# Patient Record
Sex: Female | Born: 1940 | Race: White | Hispanic: No | Marital: Married | Smoking: Never smoker
Health system: Southern US, Community
[De-identification: ages and names within clinical notes are randomized; demographics above are authoritative.]

## PROBLEM LIST (undated history)

## (undated) DIAGNOSIS — F329 Major depressive disorder, single episode, unspecified: Secondary | ICD-10-CM

## (undated) DIAGNOSIS — M76899 Other specified enthesopathies of unspecified lower limb, excluding foot: Secondary | ICD-10-CM

## (undated) DIAGNOSIS — G4733 Obstructive sleep apnea (adult) (pediatric): Secondary | ICD-10-CM

## (undated) DIAGNOSIS — E039 Hypothyroidism, unspecified: Secondary | ICD-10-CM

## (undated) DIAGNOSIS — M159 Polyosteoarthritis, unspecified: Secondary | ICD-10-CM

## (undated) DIAGNOSIS — M5030 Other cervical disc degeneration, unspecified cervical region: Secondary | ICD-10-CM

## (undated) DIAGNOSIS — E782 Mixed hyperlipidemia: Secondary | ICD-10-CM

## (undated) DIAGNOSIS — R159 Full incontinence of feces: Secondary | ICD-10-CM

## (undated) DIAGNOSIS — J309 Allergic rhinitis, unspecified: Secondary | ICD-10-CM

## (undated) DIAGNOSIS — E785 Hyperlipidemia, unspecified: Secondary | ICD-10-CM

## (undated) DIAGNOSIS — K219 Gastro-esophageal reflux disease without esophagitis: Secondary | ICD-10-CM

## (undated) DIAGNOSIS — F3289 Other specified depressive episodes: Secondary | ICD-10-CM

## (undated) DIAGNOSIS — N209 Urinary calculus, unspecified: Secondary | ICD-10-CM

## (undated) DIAGNOSIS — D539 Nutritional anemia, unspecified: Secondary | ICD-10-CM

## (undated) DIAGNOSIS — R413 Other amnesia: Secondary | ICD-10-CM

## (undated) DIAGNOSIS — J4489 Other specified chronic obstructive pulmonary disease: Secondary | ICD-10-CM

## (undated) DIAGNOSIS — IMO0002 Reserved for concepts with insufficient information to code with codable children: Secondary | ICD-10-CM

## (undated) DIAGNOSIS — M1990 Unspecified osteoarthritis, unspecified site: Secondary | ICD-10-CM

## (undated) DIAGNOSIS — K5790 Diverticulosis of intestine, part unspecified, without perforation or abscess without bleeding: Secondary | ICD-10-CM

## (undated) DIAGNOSIS — M1710 Unilateral primary osteoarthritis, unspecified knee: Secondary | ICD-10-CM

## (undated) DIAGNOSIS — J449 Chronic obstructive pulmonary disease, unspecified: Secondary | ICD-10-CM

## (undated) HISTORY — DX: Mixed hyperlipidemia: E78.2

## (undated) HISTORY — DX: Unspecified osteoarthritis, unspecified site: M19.90

## (undated) HISTORY — DX: Hypothyroidism, unspecified: E03.9

## (undated) HISTORY — DX: Nutritional anemia, unspecified: D53.9

## (undated) HISTORY — DX: Polyosteoarthritis, unspecified: M15.9

## (undated) HISTORY — DX: Gastro-esophageal reflux disease without esophagitis: K21.9

## (undated) HISTORY — DX: Other specified enthesopathies of unspecified lower limb, excluding foot: M76.899

## (undated) HISTORY — DX: Other specified depressive episodes: F32.89

## (undated) HISTORY — DX: Full incontinence of feces: R15.9

## (undated) HISTORY — DX: Unilateral primary osteoarthritis, unspecified knee: M17.10

## (undated) HISTORY — DX: Other cervical disc degeneration, unspecified cervical region: M50.30

## (undated) HISTORY — DX: Chronic obstructive pulmonary disease, unspecified: J44.9

## (undated) HISTORY — DX: Major depressive disorder, single episode, unspecified: F32.9

## (undated) HISTORY — DX: Reserved for concepts with insufficient information to code with codable children: IMO0002

## (undated) HISTORY — DX: Urinary calculus, unspecified: N20.9

## (undated) HISTORY — DX: Allergic rhinitis, unspecified: J30.9

## (undated) HISTORY — DX: Obstructive sleep apnea (adult) (pediatric): G47.33

## (undated) HISTORY — PX: INGUINAL HERNIA REPAIR: SUR1180

## (undated) HISTORY — DX: Other specified chronic obstructive pulmonary disease: J44.89

## (undated) HISTORY — DX: Hyperlipidemia, unspecified: E78.5

## (undated) HISTORY — DX: Diverticulosis of intestine, part unspecified, without perforation or abscess without bleeding: K57.90

## (undated) HISTORY — DX: Other amnesia: R41.3

---

## 2000-02-15 DIAGNOSIS — K219 Gastro-esophageal reflux disease without esophagitis: Secondary | ICD-10-CM

## 2000-02-15 HISTORY — DX: Gastro-esophageal reflux disease without esophagitis: K21.9

## 2001-02-18 DIAGNOSIS — J449 Chronic obstructive pulmonary disease, unspecified: Secondary | ICD-10-CM

## 2001-02-18 DIAGNOSIS — N209 Urinary calculus, unspecified: Secondary | ICD-10-CM

## 2001-02-18 HISTORY — DX: Chronic obstructive pulmonary disease, unspecified: J44.9

## 2001-02-18 HISTORY — DX: Urinary calculus, unspecified: N20.9

## 2001-11-25 DIAGNOSIS — F329 Major depressive disorder, single episode, unspecified: Secondary | ICD-10-CM

## 2001-11-25 HISTORY — DX: Major depressive disorder, single episode, unspecified: F32.9

## 2004-07-20 DIAGNOSIS — M76899 Other specified enthesopathies of unspecified lower limb, excluding foot: Secondary | ICD-10-CM | POA: Insufficient documentation

## 2004-07-20 DIAGNOSIS — M159 Polyosteoarthritis, unspecified: Secondary | ICD-10-CM | POA: Insufficient documentation

## 2004-12-09 DIAGNOSIS — M5137 Other intervertebral disc degeneration, lumbosacral region: Secondary | ICD-10-CM

## 2004-12-09 DIAGNOSIS — M51379 Other intervertebral disc degeneration, lumbosacral region without mention of lumbar back pain or lower extremity pain: Secondary | ICD-10-CM

## 2004-12-09 HISTORY — DX: Other intervertebral disc degeneration, lumbosacral region without mention of lumbar back pain or lower extremity pain: M51.379

## 2006-05-14 DIAGNOSIS — M5030 Other cervical disc degeneration, unspecified cervical region: Secondary | ICD-10-CM | POA: Insufficient documentation

## 2007-03-27 DIAGNOSIS — D539 Nutritional anemia, unspecified: Secondary | ICD-10-CM | POA: Insufficient documentation

## 2007-03-27 HISTORY — DX: Nutritional anemia, unspecified: D53.9

## 2008-01-13 DIAGNOSIS — J309 Allergic rhinitis, unspecified: Secondary | ICD-10-CM

## 2008-01-13 HISTORY — DX: Allergic rhinitis, unspecified: J30.9

## 2013-03-19 ENCOUNTER — Encounter: Payer: Self-pay | Admitting: Gastroenterology

## 2013-04-11 ENCOUNTER — Encounter: Payer: Self-pay | Admitting: Gastroenterology

## 2013-04-11 ENCOUNTER — Other Ambulatory Visit: Payer: Medicare Other

## 2013-04-11 ENCOUNTER — Ambulatory Visit (INDEPENDENT_AMBULATORY_CARE_PROVIDER_SITE_OTHER): Payer: Medicare Other | Admitting: Gastroenterology

## 2013-04-11 VITALS — BP 130/74 | HR 68 | Ht 64.5 in | Wt 146.4 lb

## 2013-04-11 DIAGNOSIS — K219 Gastro-esophageal reflux disease without esophagitis: Secondary | ICD-10-CM

## 2013-04-11 DIAGNOSIS — R1013 Epigastric pain: Secondary | ICD-10-CM

## 2013-04-11 DIAGNOSIS — Z791 Long term (current) use of non-steroidal anti-inflammatories (NSAID): Secondary | ICD-10-CM

## 2013-04-11 DIAGNOSIS — K3189 Other diseases of stomach and duodenum: Secondary | ICD-10-CM

## 2013-04-11 DIAGNOSIS — R1314 Dysphagia, pharyngoesophageal phase: Secondary | ICD-10-CM

## 2013-04-11 DIAGNOSIS — M1990 Unspecified osteoarthritis, unspecified site: Secondary | ICD-10-CM

## 2013-04-11 MED ORDER — OMEPRAZOLE 20 MG PO CPDR: 20.0000 mg | DELAYED_RELEASE_CAPSULE | Freq: Two times a day (BID) | ORAL | Status: DC

## 2013-04-11 NOTE — Progress Notes (Signed)
History of Present Illness:  This is a very nice 72 year old retired Caucasian female from [place] referred by Dr. Majel Homer.  Accompanying the patient are a variety of notes and lab review use.  Apparently the patient also has had recent normal upper abdominal ultrasound exam.  She is referred for evaluation of multiple GI complaints.  This patient is a" years" of a" finicky stomach" which she described as postprandial gas, bloating, and occasional diarrhea usually one hour after meals without any real precipitating event with specific food intolerances except lactose..  This occurs approximately once a week without real precipitating or otherwise alleviating events.  Since June of this year she's been placed on Prilosec 40 mg/day without improvement.  She has chronic degenerative arthritis ,and is on Lodine 500 mg twice a day.  She does have reflux symptoms, hoarseness, also describes intermittent dysphagia for solids and liquids.  She's had several colonoscopies, most recently 2 years ago, but I do not have these reports.  Apparently these were normal.  Patient not had previous endoscopy or barium swallow.  Over the last year she has voluntarily lost 20 pounds on a low carbohydrate diet.  There is no history of bowel or regularity, melena, hematochezia, or abdominal pain.  Family history is remarkable for son who apparently has IBS.  She denies foreign travel, infectious disease exposure, or heavy use of alcohol or cigarettes.  There also is no history of undiagnosed skin rashes, swollen joints, or mouth sores .She denies Raynaud's phenomena, history of chronic liver disease, prior hepatitis or pancreatitis, or any chronic hematologic disorders.  In addition to Lodine she is on Flexeril,Plaquenil 200 mg twice a day, Synthroid, Zocor, B6 and B12 supplements   I have reviewed this patient's present history, medical and surgical past history, allergies and medications.     ROS:   All  systems were reviewed and are negative unless otherwise stated in the HPI.  Allergies  Allergen Reactions  . Amoxicillin Nausea Only   Outpatient Prescriptions Prior to Visit  Medication Sig Dispense Refill  . b complex vitamins tablet Take 1 tablet by mouth daily.      . Biotin 2500 MCG CAPS Take 1 capsule by mouth 2 (two) times daily.      . Calcium-Magnesium-Vitamin D (CALCIUM MAGNESIUM PO) Take 1 tablet by mouth daily.      Marland Kitchen etodolac (LODINE) 500 MG tablet Take 500 mg by mouth 2 (two) times daily.      . hydroxychloroquine (PLAQUENIL) 200 MG tablet Take 200 mg by mouth 2 (two) times daily.      Marland Kitchen levothyroxine (SYNTHROID, LEVOTHROID) 75 MCG tablet Take 75 mcg by mouth daily before breakfast.      . omeprazole (PRILOSEC) 20 MG capsule Take 20 mg by mouth 2 (two) times daily.      Marland Kitchen pyridOXINE (VITAMIN B-6) 100 MG tablet Take 100 mg by mouth daily.      . simvastatin (ZOCOR) 40 MG tablet Take 40 mg by mouth every evening.      . vitamin B-12 (CYANOCOBALAMIN) 1000 MCG tablet Take 1,000 mcg by mouth daily.      Marland Kitchen aspirin 81 MG tablet Take 81 mg by mouth daily.      . Cholecalciferol (VITAMIN D-3) 5000 UNITS TABS Take 1 tablet by mouth daily.      . Multiple Vitamin (MULTIVITAMIN) tablet Take 1 tablet by mouth daily.       No facility-administered medications prior to visit.   Past  Medical History  Diagnosis Date  . Unspecified hypothyroidism   . Esophageal reflux   . Chronic airway obstruction, not elsewhere classified   . Urinary calculus, unspecified   . Memory loss   . Depressive disorder, not elsewhere classified   . Unspecified arthropathy, lower leg   . Generalized osteoarthrosis, involving multiple sites   . Enthesopathy of hip region   . Other and unspecified hyperlipidemia   . Degeneration of cervical intervertebral disc   . Unspecified deficiency anemia   . Allergic rhinitis, cause unspecified   . Full incontinence of feces   . Arthritis   . Diverticulosis     Past Surgical History  Procedure Laterality Date  . Inguinal hernia repair Right    History   Social History  . Marital Status: Married    Spouse Name: N/A    Number of Children: 2  . Years of Education: N/A   Occupational History  . retired Engineer, site    Social History Main Topics  . Smoking status: Never Smoker   . Smokeless tobacco: Never Used  . Alcohol Use: Yes     Comment: moderate  . Drug Use: No  . Sexual Activity: None   Other Topics Concern  . None   Social History Narrative  . None   Family History  Problem Relation Age of Onset  . Heart disease Father   . Heart disease Mother   . Heart disease Brother        Physical Exam: At pressure 130/74, pulse 60 and regular and weight 146 with a BMI of 24.75. General well developed well nourished patient in no acute distress, appearing their stated age Eyes PERRLA, no icterus, fundoscopic exam per opthamologist Skin no lesions noted Neck supple, no adenopathy, no thyroid enlargement, no tenderness Chest clear to percussion and auscultation Heart no significant murmurs, gallops or rubs noted Abdomen no hepatosplenomegaly masses or tenderness, BS normal.  No abdominal distention and bowel sounds are normal.  She does have a prominent epigastric aortic pulse no significant abdominal bruits. Rectal inspection normal no fissures, or fistulae noted.  No masses or tenderness on digital exam. Stool guaiac negative. Extremities no acute joint lesions, edema, phlebitis or evidence of cellulitis. Neurologic patient oriented x 3, cranial nerves intact, no focal neurologic deficits noted. Psychological mental status normal and normal affect.  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  Patient also has chronic dyspepsia may have H. pylori infection, and may have a gastric or duodenal ulcer associated with her chronic NSAID use for her degenerative arthritis.  I've increased her  Prilosec to 40 mg twice a day, and have scheduled endoscopic exam.  We also will screen her for celiac disease with celiac serologies today and small bowel biopsy the time of her endoscopy.  She seemed up-to-date on her colonoscopy exams.  I've advised to continue other medications as per primary care.  Review of her labs shows no abnormalities with normal CBC except for platelet count 105,000, white count 3200, and hemoglobin 12.9.  Metabolic profile/ liver function test are normal.  I could not appreciate any evidence of chronic liver disease or splenomegaly on exam today.  Serum creatinine 0.83.  Please copy her primary care physician, referring physician, and pertinent subspecialists.

## 2013-04-11 NOTE — Patient Instructions (Signed)
You have been scheduled for an endoscopy with propofol. Please follow written instructions given to you at your visit today. If you use inhalers (even only as needed), please bring them with you on the day of your procedure. Your physician has requested that you go to www.[email or web address].com and enter the access code given to you at your visit today. This web site gives a general overview about your procedure. However, you should still follow specific instructions given to you by our office regarding your preparation for the procedure.  Your physician has requested that you go to the basement for the following lab work before leaving today: Celiac Panel  Please continue Protonix twice daily, new prescription was sent to your pharmacy. __________________________________________________________________________                                               We are excited to introduce MyChart, a new best-in-class service that provides you online access to important information in your electronic medical record. We want to make it easier for you to view your health information - all in one secure location - when and where you need it. We expect MyChart will enhance the quality of care and service we provide.  When you register for MyChart, you can:    View your test results.    Request appointments and receive appointment reminders via email.    Request medication renewals.    View your medical history, allergies, medications and immunizations.    Communicate with your physician's office through a password-protected site.    Conveniently print information such as your medication lists.  To find out if MyChart is right for you, please talk to a member of our clinical staff today. We will gladly answer your questions about this free health and wellness tool.  If you are age 72 or older and want a member of your family to have access to your record, you must provide written consent by completing a  proxy form available at our office. Please speak to our clinical staff about guidelines regarding accounts for patients younger than age 72.  As you activate your MyChart account and need any technical assistance, please call the MyChart technical support line at [phone]) or email your question to [email or web address]. If you email your question(s), please include your name, a return phone number and the best time to reach you.  If you have non-urgent health-related questions, you can send a message to our office through MyChart at [email or web address]. If you have a medical emergency, call 911.  Thank you for using MyChart as your new health and wellness resource!   MyChart licensed from [employer],  [id number]. Patents Pending.

## 2013-04-14 LAB — CELIAC PANEL 10
Endomysial Screen: NEGATIVE
Gliadin IgA: 2.9 U/mL (ref <20)
Gliadin IgG: 2.7 U/mL (ref <20)
IgA: 311 mg/dL (ref 69–380)
Tissue Transglut Ab: 6.6 U/mL (ref <20)
Tissue Transglutaminase Ab, IgA: 3.3 U/mL (ref <20)

## 2013-04-30 ENCOUNTER — Encounter: Payer: Self-pay | Admitting: Gastroenterology

## 2013-04-30 ENCOUNTER — Ambulatory Visit (AMBULATORY_SURGERY_CENTER): Payer: Medicare Other | Admitting: Gastroenterology

## 2013-04-30 VITALS — BP 131/63 | HR 78 | Temp 97.7°F | Resp 18 | Ht 64.0 in | Wt 146.0 lb

## 2013-04-30 DIAGNOSIS — K219 Gastro-esophageal reflux disease without esophagitis: Secondary | ICD-10-CM

## 2013-04-30 DIAGNOSIS — D1330 Benign neoplasm of unspecified part of small intestine: Secondary | ICD-10-CM

## 2013-04-30 MED ORDER — SODIUM CHLORIDE 0.9 % IV SOLN: 500.0000 mL | INTRAVENOUS | Status: DC

## 2013-04-30 NOTE — Progress Notes (Signed)
Patient did not experience any of the following events: a burn prior to discharge; a fall within the facility; wrong site/side/patient/procedure/implant event; or a hospital transfer or hospital admission upon discharge from the facility. (G8907) Patient did not have preoperative order for IV antibiotic SSI prophylaxis. (G8918)  

## 2013-04-30 NOTE — Patient Instructions (Addendum)

## 2013-04-30 NOTE — Op Note (Signed)
[facility] [address] [place], [address]   ENDOSCOPY PROCEDURE REPORT  PATIENT: Melanie, Smith  MR#: [id number] BIRTHDATE: 05/28/1941 , 72  yrs. old GENDER: Female ENDOSCOPIST:David Hale Bogus, MD, [facility] REFERRED BY: PROCEDURE DATE:  04/30/2013 PROCEDURE:   EGD w/ biopsy and EGD w/ biopsy for H.pylori ASA CLASS:    Class II INDICATIONS: Dyspepsia. MEDICATION: propofol (Diprivan) 100mg  IV TOPICAL ANESTHETIC:   Cetacaine Spray  DESCRIPTION OF PROCEDURE:   After the risks and benefits of the procedure were explained, informed consent was obtained.  The [facility] [id number]  endoscope was introduced through the mouth  and advanced to the    .  The instrument was slowly withdrawn as the mucosa was fully examined.      DUODENUM: The duodenal mucosa showed no abnormalities in the bulb and second portion of the duodenum.  Cold forceps biopsies were taken in the bulb and second portion.  STOMACH: The mucosa of the stomach appeared normal.  A biopsy was performed.  ESOPHAGUS: The mucosa of the esophagus appeared normal. Retroflexed views revealed a small hiatal hernia.    The scope was then withdrawn from the patient and the procedure completed.  COMPLICATIONS: There were no complications.   ENDOSCOPIC IMPRESSION: 1.   The duodenal mucosa showed no abnormalities in the bulb and second portion of the duodenum .Marland KitchenSI Bx. done. 2.   The mucosa of the stomach appeared normal; biopsy.Marland KitchenCLO Bx. done  3.   The mucosa of the esophagus appeared normal ..probale chronic treated GERD.  RECOMMENDATIONS: 1.  Continue PPI 2.  Continue current medications 3.  Await biopsy results 4.  Rx CLO if positive    _______________________________ eSigned:  Mardella Layman, MD, [facility] 04/30/2013 1:55 PM      PATIENT NAME:  Melanie, Smith MR#: [id number]

## 2013-04-30 NOTE — Progress Notes (Signed)
Called to room to assist during endoscopic procedure.  Patient ID and intended procedure confirmed with present staff. Received instructions for my participation in the procedure from the performing physician.  

## 2013-04-30 NOTE — Progress Notes (Signed)
Lidocaine-40mg IV prior to Propofol InductionPropofol given over incremental dosages 

## 2013-05-01 ENCOUNTER — Telehealth: Payer: Self-pay | Admitting: *Deleted

## 2013-05-01 LAB — HELICOBACTER PYLORI SCREEN-BIOPSY: UREASE: NEGATIVE

## 2013-05-01 NOTE — Telephone Encounter (Signed)
  Follow up Call-  Call back number 04/30/2013  Post procedure Call Back phone  # [phone]  Permission to leave phone message Yes     Patient questions:  Do you have a fever, pain , or abdominal swelling? no Pain Score  0 *  Have you tolerated food without any problems? yes  Have you been able to return to your normal activities? yes  Do you have any questions about your discharge instructions: Diet   no Medications  no Follow up visit  no  Do you have questions or concerns about your Care? no  Actions: * If pain score is 4 or above: No action needed, pain <4.

## 2013-05-02 ENCOUNTER — Encounter: Payer: Self-pay | Admitting: Gastroenterology

## 2013-05-06 ENCOUNTER — Encounter: Payer: Self-pay | Admitting: Gastroenterology

## 2013-07-03 ENCOUNTER — Other Ambulatory Visit: Payer: Self-pay

## 2013-12-30 ENCOUNTER — Telehealth: Payer: Self-pay | Admitting: Gastroenterology

## 2013-12-30 NOTE — Telephone Encounter (Signed)
Patient requests to see Dr. Olevia Perches. She reports that she quit taking her Omeprazole about a month ago because it did not help her. She reports indigestion, hoarse voice and difficulty swallowing at times. These symptoms have not worsened since she stopped the Omeprazole. She states she follows a GERD diet. Scheduled with Dr. Olevia Perches on 01/08/14 at 8:45 AM.

## 2014-01-01 ENCOUNTER — Encounter: Payer: Self-pay | Admitting: *Deleted

## 2014-01-08 ENCOUNTER — Ambulatory Visit (INDEPENDENT_AMBULATORY_CARE_PROVIDER_SITE_OTHER): Payer: Medicare Other | Admitting: Internal Medicine

## 2014-01-08 ENCOUNTER — Encounter: Payer: Self-pay | Admitting: Internal Medicine

## 2014-01-08 VITALS — BP 118/60 | HR 72 | Ht 64.5 in | Wt 148.5 lb

## 2014-01-08 DIAGNOSIS — R197 Diarrhea, unspecified: Secondary | ICD-10-CM

## 2014-01-08 DIAGNOSIS — R1013 Epigastric pain: Secondary | ICD-10-CM

## 2014-01-08 MED ORDER — DICYCLOMINE HCL 20 MG PO TABS: 20.0000 mg | ORAL_TABLET | ORAL | Status: DC

## 2014-01-08 MED ORDER — SUCRALFATE 1 G PO TABS: 1.0000 g | ORAL_TABLET | Freq: Two times a day (BID) | ORAL | Status: DC

## 2014-01-08 NOTE — Progress Notes (Signed)
Melanie Smith [birth year] [id number]  Note: This dictation was prepared with Dragon digital system. Any transcriptional errors that result from this procedure are unintentional.   History of Present Illness:  This is a 73 year old, white female with several gastrointestinal issues. She is a former patient of  Dr. Sharlett Iles, last OV in September 2014 forhoarsness and cough. An upper endoscopy and small bowel biopsies were negative. She was put on omeprazole 40 mg twice daily for several months without improvement. She still has hoarseness and dry hacking cough. She has been on Lodine 500 mg twice a day for osteoarthritis. She is also on Plaquenil  200 mg twice a day. She has never had a gastric ulcer. Another complaint today is urgent bowel movements in the mornings preceded by crampy abdominal pain. She denies any rectal bleeding. She has had 2 screening colonoscopies in [place], [place] . We will request those reports. She has intentionally lost 28 pounds but has regained 8 lbs.. She denies  history of gallbladder problems. She has been on a healthy high-fiber diet.    Past Medical History  Diagnosis Date  . Unspecified hypothyroidism   . GERD (gastroesophageal reflux disease)   . Chronic airway obstruction, not elsewhere classified   . Urinary calculus, unspecified   . Memory loss   . Depressive disorder, not elsewhere classified   . Unspecified arthropathy, lower leg   . Generalized osteoarthrosis, involving multiple sites   . Enthesopathy of hip region   . Other and unspecified hyperlipidemia   . Degeneration of cervical intervertebral disc   . Unspecified deficiency anemia   . Allergic rhinitis, cause unspecified   . Full incontinence of feces   . Arthritis   . Diverticulosis     Past Surgical History  Procedure Laterality Date  . Inguinal hernia repair Right     Allergies  Allergen Reactions  . Amoxicillin Nausea Only    Family history and social history have been  reviewed.  Review of Systems: Occasional dysphagia. Dyspepsia epigastric discomfort change in bowel habits  The remainder of the 10 point ROS is negative except as outlined in the H&P  Physical Exam: General Appearance Well developed, in no distress normal voice Eyes  Non icteric  HEENT  Non traumatic, normocephalic  Mouth No lesion, tongue papillated, no cheilosis Neck Supple without adenopathy, thyroid not enlarged, no carotid bruits, no JVD Lungs Clear to auscultation bilaterally COR Normal S1, normal S2, regular rhythm, no murmur, quiet precordium Abdomen Soft nontender with normoactive bowel sounds. No distention. No tympany. Liver edge at costal margin Rectal soft Hemoccult negative stool  Extremities  No pedal edema Skin No lesions Neurological Alert and oriented x 3 Psychological Normal mood and affect  Assessment and Plan:   Problem #1 worsening cough which did not respond to high dose PPI's and therefore is not likely caused by gastroesophageal reflux. I suggested that she see and ear, nose and throat specialist for further evaluation. As far as her dyspepsia is concerned, she has been on anti-inflammatory agents Lodine and Plaquanil  long-term and although an upper endoscopy did not show an ulcer, these medications are likely to cause gastropathy.  Problem #2 I asked patient to cut back on Lodine to only one a day and discuss with her cardiologist adjustment of her medications if possible. We will add Carafate 1 g twice a day for gastropathy. As far as her lower abdominal cramps and diarrhea are concerned, it sounds more like an irritable bowel syndrome or possibly symptomatic diverticulosis.  We will start her on Bentyl 20 mg every morning as an antispasmodic. She is already taking probiotics. She did not want to take any extra fiber since she feels that she is taking enough fiber in her regular diet. We will check a sprue profile. She will let us know if symptoms  continue.    [place] Dragon 01/08/2014

## 2014-01-08 NOTE — Patient Instructions (Addendum)
We have sent the following medications to your pharmacy for you to pick up at your convenience: Bentyl 20 mg every morning Carafate 1 gram twice daily  Please decrease your Lodine to 1 tablet daily.  Please contact Dr Janace Hoard, ENT to get an appointment for your hoarseness since this does not seem GI related. His phone number is [phone].  We will get your colonoscopy reports from [place], [place].  CC:Dr Harl Bowie

## 2015-02-15 DIAGNOSIS — J383 Other diseases of vocal cords: Secondary | ICD-10-CM | POA: Insufficient documentation

## 2015-02-15 DIAGNOSIS — R1310 Dysphagia, unspecified: Secondary | ICD-10-CM

## 2015-02-15 DIAGNOSIS — R490 Dysphonia: Secondary | ICD-10-CM

## 2015-02-15 HISTORY — DX: Dysphagia, unspecified: R13.10

## 2015-02-15 HISTORY — DX: Other diseases of vocal cords: J38.3

## 2015-02-15 HISTORY — DX: Dysphonia: R49.0

## 2015-04-07 ENCOUNTER — Other Ambulatory Visit: Payer: Self-pay | Admitting: Orthopaedic Surgery

## 2015-04-07 DIAGNOSIS — M25551 Pain in right hip: Secondary | ICD-10-CM

## 2015-04-20 ENCOUNTER — Ambulatory Visit
Admission: RE | Admit: 2015-04-20 | Discharge: 2015-04-20 | Disposition: A | Discharge status: Home / Self Care | Payer: Medicare Other | Source: Ambulatory Visit | Attending: Orthopaedic Surgery | Admitting: Orthopaedic Surgery

## 2015-04-20 DIAGNOSIS — M25551 Pain in right hip: Secondary | ICD-10-CM

## 2016-03-13 DIAGNOSIS — M170 Bilateral primary osteoarthritis of knee: Secondary | ICD-10-CM | POA: Insufficient documentation

## 2016-03-13 HISTORY — DX: Bilateral primary osteoarthritis of knee: M17.0

## 2016-06-14 ENCOUNTER — Encounter (INDEPENDENT_AMBULATORY_CARE_PROVIDER_SITE_OTHER): Payer: Medicare Other | Admitting: Ophthalmology

## 2016-06-14 DIAGNOSIS — H353131 Nonexudative age-related macular degeneration, bilateral, early dry stage: Secondary | ICD-10-CM | POA: Diagnosis not present

## 2016-06-14 DIAGNOSIS — H40013 Open angle with borderline findings, low risk, bilateral: Secondary | ICD-10-CM

## 2016-06-14 DIAGNOSIS — H43813 Vitreous degeneration, bilateral: Secondary | ICD-10-CM

## 2016-10-03 DIAGNOSIS — D696 Thrombocytopenia, unspecified: Secondary | ICD-10-CM | POA: Insufficient documentation

## 2016-10-03 HISTORY — DX: Thrombocytopenia, unspecified: D69.6

## 2016-12-26 DIAGNOSIS — R3981 Functional urinary incontinence: Secondary | ICD-10-CM

## 2016-12-26 HISTORY — DX: Functional urinary incontinence: R39.81

## 2017-05-17 ENCOUNTER — Other Ambulatory Visit: Payer: Self-pay | Admitting: Orthopedic Surgery

## 2017-05-17 DIAGNOSIS — M545 Low back pain: Secondary | ICD-10-CM

## 2017-06-01 ENCOUNTER — Ambulatory Visit
Admission: RE | Admit: 2017-06-01 | Discharge: 2017-06-01 | Disposition: A | Discharge status: Home / Self Care | Payer: Medicare Other | Source: Ambulatory Visit | Attending: Orthopedic Surgery | Admitting: Orthopedic Surgery

## 2017-06-01 DIAGNOSIS — M545 Low back pain: Secondary | ICD-10-CM

## 2017-07-06 DIAGNOSIS — I358 Other nonrheumatic aortic valve disorders: Secondary | ICD-10-CM | POA: Insufficient documentation

## 2017-07-06 DIAGNOSIS — I351 Nonrheumatic aortic (valve) insufficiency: Secondary | ICD-10-CM | POA: Insufficient documentation

## 2017-07-06 DIAGNOSIS — I340 Nonrheumatic mitral (valve) insufficiency: Secondary | ICD-10-CM

## 2017-07-06 HISTORY — DX: Nonrheumatic aortic (valve) insufficiency: I35.1

## 2017-07-06 HISTORY — DX: Nonrheumatic mitral (valve) insufficiency: I34.0

## 2017-07-06 HISTORY — DX: Other nonrheumatic aortic valve disorders: I35.8

## 2018-01-08 DIAGNOSIS — M5416 Radiculopathy, lumbar region: Secondary | ICD-10-CM | POA: Insufficient documentation

## 2018-01-08 HISTORY — DX: Radiculopathy, lumbar region: M54.16

## 2018-02-24 DIAGNOSIS — M7989 Other specified soft tissue disorders: Secondary | ICD-10-CM

## 2018-02-24 DIAGNOSIS — M79604 Pain in right leg: Secondary | ICD-10-CM

## 2018-02-24 HISTORY — DX: Pain in right leg: M79.604

## 2018-02-24 HISTORY — DX: Other specified soft tissue disorders: M79.89

## 2018-03-25 DIAGNOSIS — I10 Essential (primary) hypertension: Secondary | ICD-10-CM

## 2018-03-25 HISTORY — DX: Essential (primary) hypertension: I10

## 2018-09-29 DIAGNOSIS — T84099A Other mechanical complication of unspecified internal joint prosthesis, initial encounter: Secondary | ICD-10-CM | POA: Insufficient documentation

## 2018-09-29 HISTORY — DX: Other mechanical complication of unspecified internal joint prosthesis, initial encounter: T84.099A

## 2019-02-24 DIAGNOSIS — M461 Sacroiliitis, not elsewhere classified: Secondary | ICD-10-CM

## 2019-02-24 HISTORY — DX: Sacroiliitis, not elsewhere classified: M46.1

## 2019-05-19 DIAGNOSIS — I6522 Occlusion and stenosis of left carotid artery: Secondary | ICD-10-CM

## 2019-05-19 HISTORY — DX: Occlusion and stenosis of left carotid artery: I65.22

## 2019-05-27 DIAGNOSIS — R011 Cardiac murmur, unspecified: Secondary | ICD-10-CM | POA: Insufficient documentation

## 2019-05-27 HISTORY — DX: Cardiac murmur, unspecified: R01.1

## 2019-06-20 ENCOUNTER — Ambulatory Visit: Payer: Medicare Other | Admitting: Cardiovascular Disease

## 2019-07-11 IMAGING — MR MR LUMBAR SPINE W/O CM
4 of 5 series · 23 of 48 positions shown · non-contrast
Comparison: None.

CLINICAL DATA: 76-year-old female with lumbar back pain radiating
to the right hip and need for 3 months. Numbness and weakness in the
right leg. No known injury. Spinal injection in [REDACTED] with
temporary relief.

EXAM:
MRI LUMBAR SPINE WITHOUT CONTRAST
TECHNIQUE: Multiplanar, multisequence MR imaging of the lumbar spine was
performed. No intravenous contrast was administered.

[Series 3: T2 · sagittal · 4.0mm · 0.55mm/px · 7 of 17 slices shown (1 of 2)]
[im 1/17]
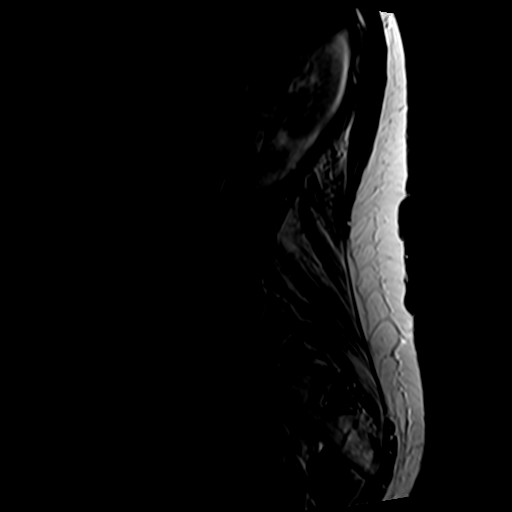
[im 3/17]
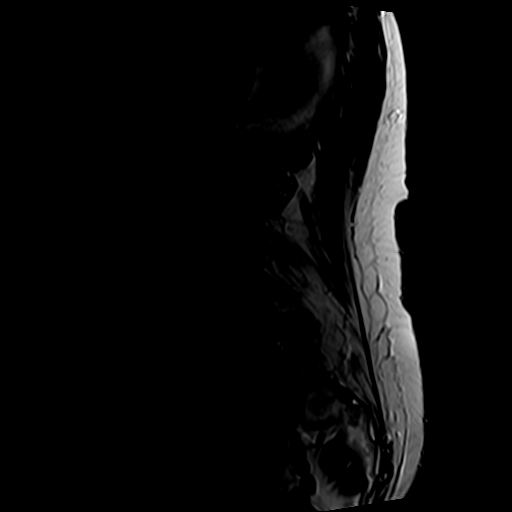
[im 6/17]
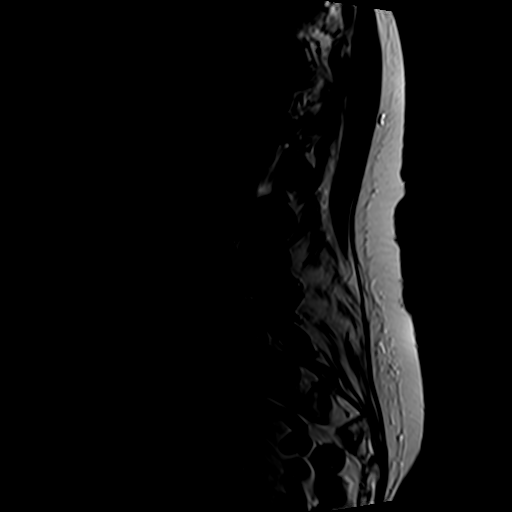
[im 9/17]
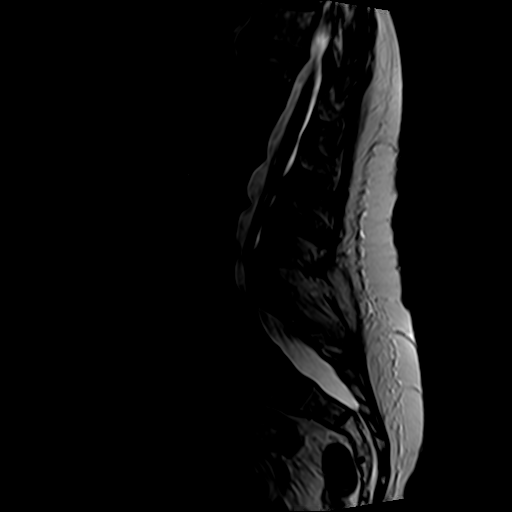
[im 11/17]
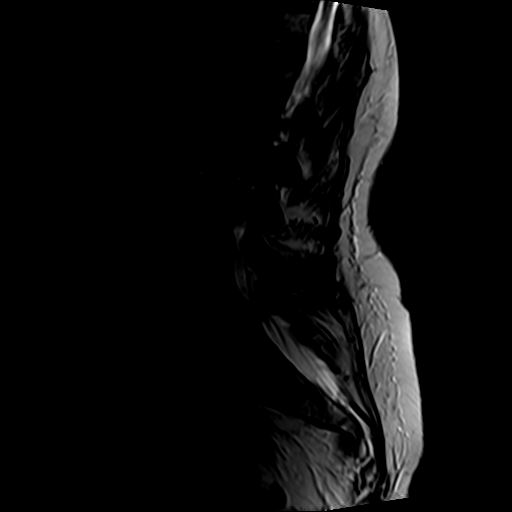
[im 14/17]
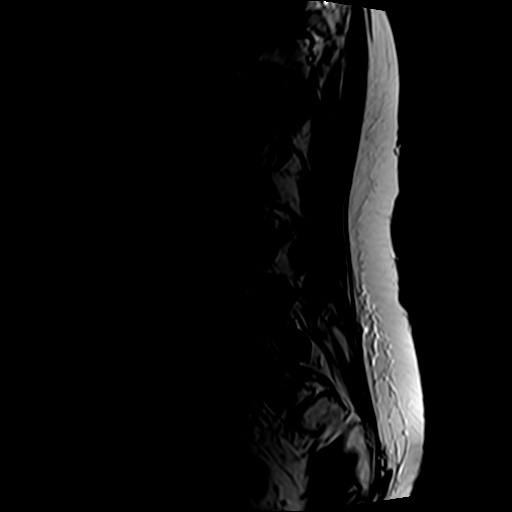
[im 17/17]
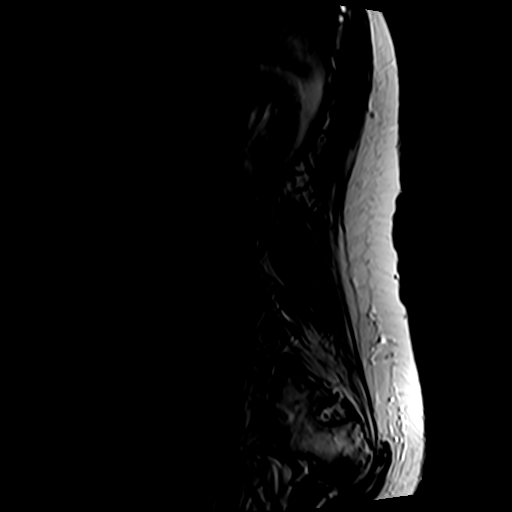

[Series 5: T1 · sagittal · 4.0mm · 0.55mm/px · 5 of 17 slices shown (1 of 2)]
[im 1/17]
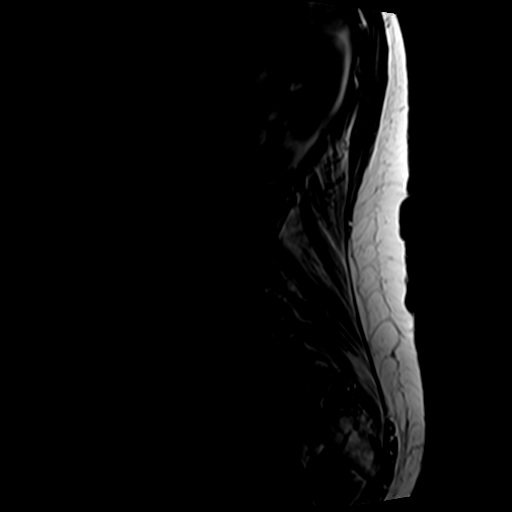
[im 4/17]
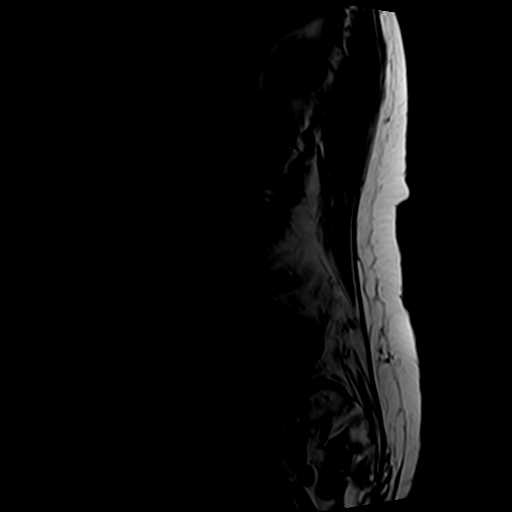
[im 7/17]
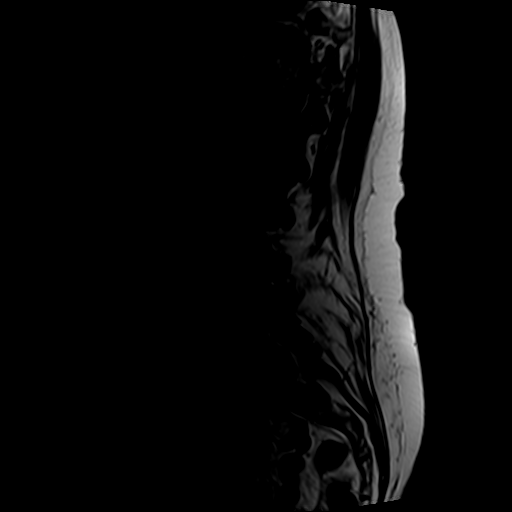
[im 10/17]
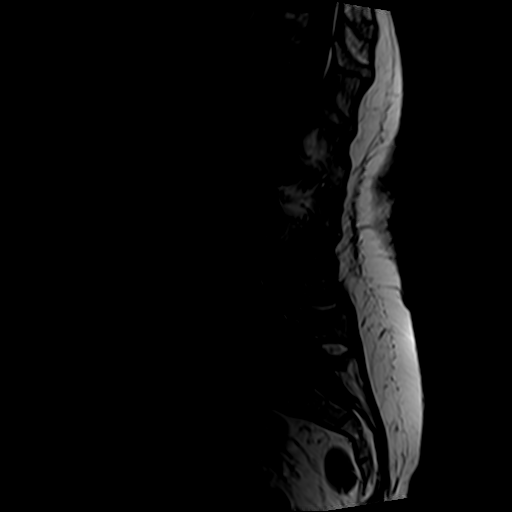
[im 17/17]
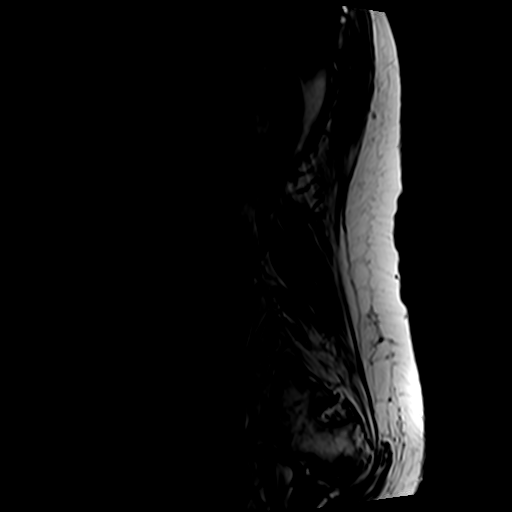

[Series 6: T2 · axial · 4.0mm · 0.78mm/px · z∈[+4,+210]mm · 8 of 38 slices shown (2 of 2)]
[im 1/38]
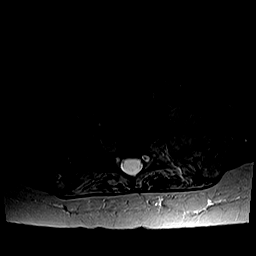
[im 6/38]
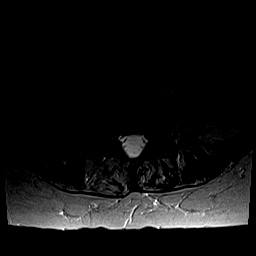
[im 12/38]
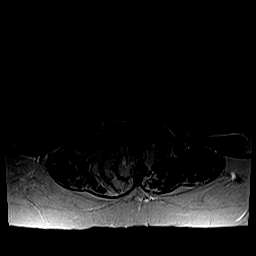
[im 18/38]
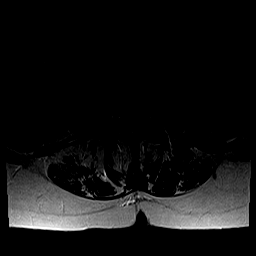
[im 20/38]
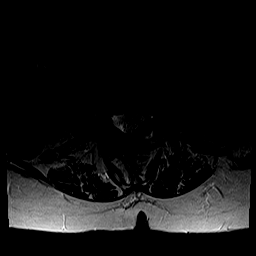
[im 26/38]
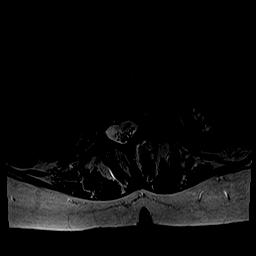
[im 32/38]
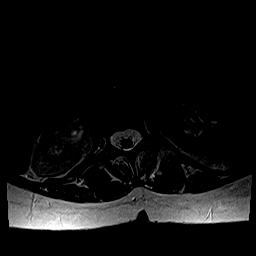
[im 38/38]
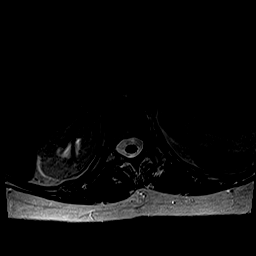

[Series 7: T1 · axial · 4.0mm · 0.39mm/px · z∈[+28,+182]mm · 3 of 38 slices shown (2 of 2)]
[im 6/38]
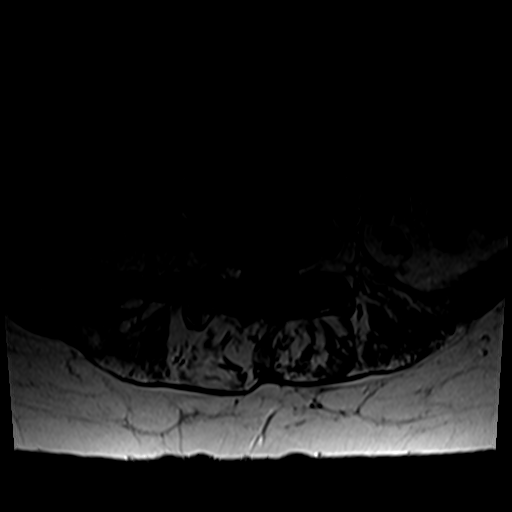
[im 20/38]
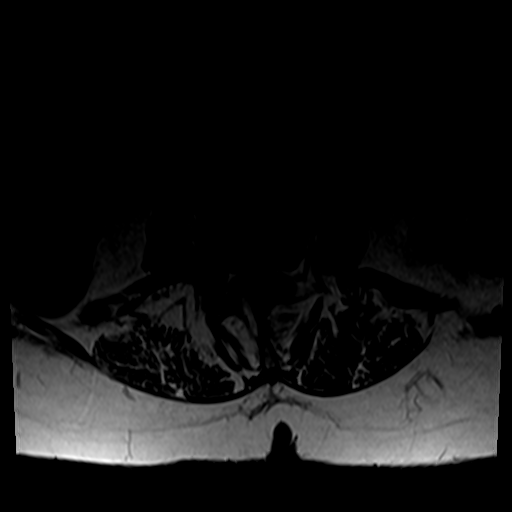
[im 32/38]
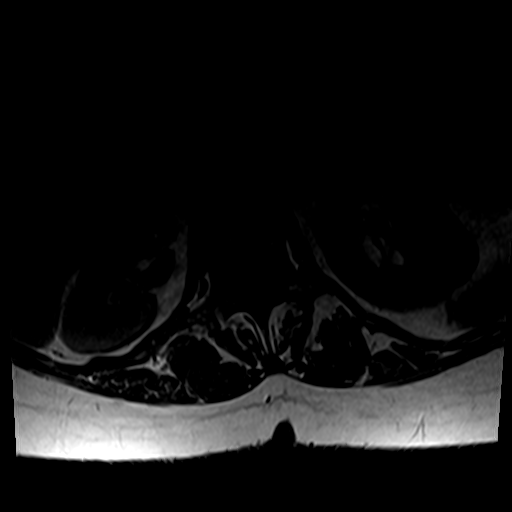

[23 of 48 positions shown; findings below may reference images not displayed]

FINDINGS: Segmentation: Lumbar segmentation appears to be normal and will be
designated as such for this report.

Alignment: Moderate dextroconvex lumbar scoliosis. Mildly
exaggerated lumbar lordosis. Mild retrolisthesis of L2 on L3. Mild
grade 1 anterolisthesis of L4 on L5 (3-4 mm).

Vertebrae: Mix of acute and chronic degenerative endplate marrow
signal changes at L2-L3 eccentric to the left. Some marrow edema.
Background bone marrow signal is within normal limits. No other
marrow edema or acute osseous abnormality. Intact visible sacrum and
SI joints.

Conus medullaris: Extends to the L1-L2 level and appears normal.

Paraspinal and other soft tissues: Benign appearing renal parapelvic
cysts. Negative visualized abdominal viscera. Right erector spinae
muscle atrophy (Series 7, image 28). No acute paraspinal soft tissue
findings.

Disc levels:

T11-T12: Disc desiccation and disc space loss with circumferential
disc bulge. Mild facet hypertrophy. No significant stenosis.

T12-L1:  Mild facet and ligament flavum hypertrophy.

L1-L2: Left eccentric circumferential disc bulge. Mild facet and
ligament flavum hypertrophy. Mild left L1 foraminal stenosis.

L2-L3: Severe disc space loss. Left eccentric and far lateral disc
osteophyte complex. Broad-based posterior component. Mild to
moderate facet and ligament flavum hypertrophy. Mild spinal
stenosis. Mild bilateral L2 foraminal stenosis. Borderline to mild
right lateral recess stenosis.

L3-L4: Circumferential disc bulge with broad-based posterior
component. Severe facet and ligament flavum hypertrophy. Moderate to
severe spinal stenosis (series 6, image 21) with right greater than
left lateral recess stenosis.

L4-L5: Mild grade 1 anterolisthesis. Circumferential disc/ pseudo
disc with broad-based posterior component. Severe facet and moderate
to severe ligament flavum hypertrophy. Moderate to severe spinal
stenosis and left greater than right lateral recess stenosis (series
6, image 27).

L5-S1: Mild anterior and lateral disc bulging. Moderate to severe
facet hypertrophy. No stenosis.
IMPRESSION: 1. Moderate dextroconvex lumbar scoliosis with mildly exaggerated
lumbar lordosis and mild spondylolisthesis at L2-L3 and L4-L5.
2. Multifactorial moderate or severe spinal stenosis at L3-L4 (with
right greater than left lateral recess stenosis) and L4-L5 (with
lateral recess stenosis greater on the left). Severe chronic facet
degeneration at those levels.
3. Up to mild spinal stenosis, foraminal, and right lateral recess
stenosis at L2-L3. The

## 2019-10-29 DIAGNOSIS — Z96651 Presence of right artificial knee joint: Secondary | ICD-10-CM | POA: Insufficient documentation

## 2019-10-29 HISTORY — DX: Presence of right artificial knee joint: Z96.651

## 2022-12-14 ENCOUNTER — Encounter: Payer: Self-pay | Admitting: Physician Assistant

## 2023-01-24 ENCOUNTER — Ambulatory Visit (INDEPENDENT_AMBULATORY_CARE_PROVIDER_SITE_OTHER): Payer: Medicare Other | Admitting: Physician Assistant

## 2023-01-24 ENCOUNTER — Other Ambulatory Visit (INDEPENDENT_AMBULATORY_CARE_PROVIDER_SITE_OTHER): Payer: Medicare Other

## 2023-01-24 ENCOUNTER — Ambulatory Visit: Payer: Medicare Other

## 2023-01-24 ENCOUNTER — Encounter: Payer: Self-pay | Admitting: Physician Assistant

## 2023-01-24 VITALS — BP 120/80 | HR 70 | Resp 18 | Ht 63.0 in | Wt 150.0 lb

## 2023-01-24 DIAGNOSIS — F0280 Dementia in other diseases classified elsewhere without behavioral disturbance: Secondary | ICD-10-CM | POA: Insufficient documentation

## 2023-01-24 DIAGNOSIS — R413 Other amnesia: Secondary | ICD-10-CM | POA: Diagnosis not present

## 2023-01-24 DIAGNOSIS — G309 Alzheimer's disease, unspecified: Secondary | ICD-10-CM | POA: Diagnosis not present

## 2023-01-24 LAB — VITAMIN B12: Vitamin B-12: 970 pg/mL — ABNORMAL HIGH (ref 211–911)

## 2023-01-24 LAB — TSH: TSH: 4.9 u[IU]/mL (ref 0.35–5.50)

## 2023-01-24 MED ORDER — MEMANTINE HCL 10 MG PO TABS: 10.0000 mg | ORAL_TABLET | Freq: Two times a day (BID) | ORAL | 3 refills | Status: DC

## 2023-01-24 NOTE — Progress Notes (Signed)
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  Melanie Smith is a very pleasant 82 y.o. year old RH female with a history of hypertension, hyperlipidemia, hypothyroidism, seen today for evaluation of memory loss and continuation of care after extensive neurological evaluation while in [place], [place], including checking for APO E resulting on E3/E3 genotype.  In October 29, 2022  PET/CT showed positive cortical activity/amyloid deposition throughout the frontal, parietal, temporal and occipital lobes with loss of gray-white matter contrast.  MRI performed at [facility] in [facility] 12/21/2022 (no films available for review at this time) is remarkable (prior report) for chronic small vessel disease otherwise "exceptionally normal "MRI in view of her age. Patient is on memantine 10 mg twice daily since March 2024, tolerating well.  Patient is able to participate on his IADLs and to drive without difficulties. Patient may be a potential candidate for new upcoming antidementia agents. As such, she is interested in proceeding with a referral to a tertiary center I.e [facility] to discuss other therapeutic options if she were to qualify.   Recommended the patient bring the recent MRI brain and PET CT brain disk to further evaluate images, and to assess for underlying structural abnormality and vascular load  Neurocognitive testing to further evaluate cognitive concerns, clarity of diagnosis,  and determine other underlying cause of memory changes, including potential contribution from sleep, anxiety, or depression  Continue Memantine 10 mg twice daily. Side effects were discussed  Check B12 and TSH Continue to control mood as per PCP Recommend good control of cardiovascular risk factors.   Folllow up in 6 months  Subjective:    The patient is accompanied by her husband who  supplements the history.    How long did patient have memory difficulties? "Can't give you an absolute answer but has been present in my mind  for at least 3 years with  my husband noticed for the last year. He is hard of hearing so my conversations with him are not lengthy, my sister in [place] would be able to tell more".  " I can't tell the specific trigger, but I am increasingly aware".  She may have some difficulties remembering recent information.  repeats oneself?  Endorsed, but not that much  Disoriented when walking into a room?  Patient denies  Leaving objects in unusual places?   denies   Wandering behavior? denies   Any personality changes ?  Patient gets very frustrated when she cannot remember. She notices that she is more emotional than before.    Any history of depression?: denies   Hallucinations or paranoia?  denies   Seizures? denies    Any sleep changes?  Sleeps well. Denies  vivid dreams, REM behavior or sleepwalking   Sleep apnea?  Denies.  Any hygiene concerns?  denies   Independent of bathing and dressing?  Endorsed  Does the patient need help with medications?  Patient is in charge   Who is in charge of the finances? Husband  is in charge     Any changes in appetite?   denies     Patient have trouble swallowing? She has a history of esophageal stricture, does not want esophageal dilatation because it involves anesthesia.   Does the patient cook? Yes.  Any kitchen accidents such as leaving the stove on? Patient denies   Any headaches?  denies   Chronic back pain?  denies   Ambulates with difficulty? Denies, walks frequently.  Recent falls or head injuries? denies     Vision changes? Denies Unilateral weakness, numbness or tingling?  denies   Any tremors?  denies   Any anosmia?  denies   Any incontinence of urine? denies   Any bowel dysfunction? denies      Patient lives with her husband History of heavy alcohol intake? denies   History of heavy tobacco use?  denies   Family history of dementia?  Denies  Does patient drive? Yes, denies getting lost or any other driving issues  Master's degree Education, BA in [place] and [place] .   Allergies  Allergen Reactions   Amoxicillin Nausea Only    Current Outpatient Medications  Medication Instructions   Calcium-Magnesium-Vitamin D (CALCIUM MAGNESIUM PO) 1 tablet, Daily   Cyanocobalamin (VITAMIN B-12 PO) Daily   cyclobenzaprine (FLEXERIL) 5 mg, Daily PRN   dicyclomine (BENTYL) 20 mg, Oral, BH-each morning   etodolac (LODINE) 500 mg, 2 times daily   hydroxychloroquine (PLAQUENIL) 200 mg, 2 times daily   levothyroxine (SYNTHROID) 75 mcg, Daily before breakfast   Probiotic Product (PROBIOTIC DAILY PO) 1 tablet, Daily   simvastatin (ZOCOR) 40 mg, Every evening   sucralfate (CARAFATE) 1 g, Oral, 2 times daily     VITALS:   Vitals:   01/24/23 0937  Pulse: 70  Resp: 18  SpO2: 98%  Weight: 150 lb (68 kg)  Height: 5\' 3"  (1.6 m)       No data to display          PHYSICAL EXAM   HEENT:  Normocephalic, atraumatic. The mucous membranes are moist. The superficial temporal arteries are without ropiness or tenderness. Cardiovascular: Regular rate and rhythm. Lungs: Clear to auscultation bilaterally. Neck: There are no carotid bruits noted bilaterally.  NEUROLOGICAL:    01/24/2023    9:00 AM  Montreal Cognitive Assessment   Visuospatial/ Executive (0/5) 3  Naming (0/3) 3  Attention: Read list of digits (0/2) 2  Attention: Read list of letters (0/1) 1  Attention: Serial 7 subtraction starting at 100 (0/3) 3  Language: Repeat phrase (0/2) 2  Language : Fluency (0/1) 1  Abstraction (0/2) 1  Delayed Recall (0/5) 0  Orientation (0/6) 5  Total 21  Adjusted Score (based on education) 21        No data to display           Orientation:  Alert and oriented to person, place and time. No aphasia or dysarthria. Fund of knowledge is appropriate. Recent memory impaired and remote memory  intact.  Attention and concentration are normal.  Able to name objects and repeat phrases. Delayed recall 0/5 Cranial nerves: There is good facial symmetry. Extraocular muscles are intact and visual fields are full to confrontational testing. Speech is fluent and clear. no tongue deviation. Hearing is intact to conversational tone. Tone: Tone is good throughout. Sensation: Sensation is intact to light touch and pinprick throughout. Vibration is intact at the bilateral big toe.There is no extinction with double simultaneous stimulation.   Coordination: The patient has no difficulty with RAM's or FNF bilaterally. Normal finger to nose  Motor: Strength is 5/5 in the bilateral upper and lower extremities. There is no pronator drift. There are no fasciculations noted. DTR's: Deep tendon reflexes are 2/4 at the bilateral biceps, triceps, brachioradialis, patella and achilles.  Plantar responses are downgoing bilaterally. Gait and Station: The patient is able  to ambulate without difficulty.The patient is able to ambulate in a tandem fashion, able to stand in the Romberg position.     Thank you for allowing [place] the opportunity to participate in the care of this nice patient. Please do not hesitate to contact us for any questions or concerns.   Total time spent on today's visit was 60 minutes dedicated to this patient today, preparing to see patient, examining the patient, ordering tests and/or medications and counseling the patient, documenting clinical information in the EHR or other health record, independently interpreting results and communicating results to the patient/family, discussing treatment and goals, answering patient's questions and coordinating care.  Cc:  Majel Homer, MD  Marlowe Kays 01/24/2023 10:55 AM

## 2023-01-24 NOTE — Patient Instructions (Addendum)
It was a pleasure to see you today at our office.   Recommendations:  Follow up in 6  months Continue Memantine 10 mg twice daily. Side effects were discussed  Neuropsych testing for clarity of diagnosis and disease trajectory   Labs today  Referral to [facility] to further evaluate for potential study drugs  Call for  ongoing studies for Alzheimer's disease feel free to contact to:  Amy Obssi Clinical Research Coordinator [facility] Department of Neurology Neurosciences Clinical Research Organization Phone: [phone] [email or web address]     For assessment of decision of mental capacity and competency:  Call Dr. Erick Blinks, geriatric psychiatrist at [phone] Counseling regarding caregiver distress, including caregiver depression, anxiety and issues regarding community resources, adult day care programs, adult living facilities, or memory care questions:  please contact your  Primary Doctor's Social Worker  Whom to call: Memory  decline, memory medications: Call our office [phone]  For psychiatric meds, mood meds: Please have your primary care physician manage these medications.  If you have any severe symptoms of a stroke, or other severe issues such as confusion,severe chills or fever, etc call 911 or go to the ER as you may need to be evaluated further    You have been referred for a neuropsychological evaluation (i.e., evaluation of memory and thinking abilities). Please bring someone with you to this appointment if possible, as it is helpful for the doctor to hear from both you and another adult who knows you well. Please bring eyeglasses and hearing aids if you wear them.    The evaluation will take approximately 3 hours and has two parts:   The first part is a clinical interview with the neuropsychologist (Dr. Milbert Coulter or Dr. Roseanne Reno). During the interview, the neuropsychologist will speak with you and the individual you brought to the appointment.     The second part of the evaluation is testing with the doctor's technician Annabelle Harman or Selena Batten). During the testing, the technician will ask you to remember different types of material, solve problems, and answer some questionnaires. Your family member will not be present for this portion of the evaluation.   Please note: We must reserve several hours of the neuropsychologist's time and the psychometrician's time for your evaluation appointment. As such, there is a No-Show fee of $100. If you are unable to attend any of your appointments, please contact our office as soon as possible to reschedule.   RECOMMENDATIONS FOR ALL PATIENTS WITH MEMORY PROBLEMS: 1. Continue to exercise (Recommend 30 minutes of walking everyday, or 3 hours every week) 2. Increase social interactions - continue going to [place] and enjoy social gatherings with friends and family 3. Eat healthy, avoid fried foods and eat more fruits and vegetables 4. Maintain adequate blood pressure, blood sugar, and blood cholesterol level. Reducing the risk of stroke and cardiovascular disease also helps promoting better memory. 5. Avoid stressful situations. Live a simple life and avoid aggravations. Organize your time and prepare for the next day in anticipation. 6. Sleep well, avoid any interruptions of sleep and avoid any distractions in the bedroom that may interfere with adequate sleep quality 7. Avoid sugar, avoid sweets as there is a strong link between excessive sugar intake, diabetes, and cognitive impairment We discussed the Mediterranean diet, which has been shown to help patients reduce the risk of progressive memory disorders and reduces cardiovascular risk. This includes eating fish, eat fruits and green leafy vegetables, nuts like almonds and hazelnuts, walnuts, and also use olive  oil. Avoid fast foods and fried foods as much as possible. Avoid sweets and sugar as sugar use has been linked to worsening of memory function.  There is  always a concern of gradual progression of memory problems. If this is the case, then we may need to adjust level of care according to patient needs. Support, both to the patient and caregiver, should then be put into place.    The Alzheimer's Association is here all day, every day for people facing Alzheimer's disease through our free 24/7 Helpline: [phone]. The Helpline provides reliable information and support to all those who need assistance, such as individuals living with memory loss, Alzheimer's or other dementia, caregivers, health care professionals and the public.  Our highly trained and knowledgeable staff can help you with: Understanding memory loss, dementia and Alzheimer's  Medications and other treatment options  General information about aging and brain health  Skills to provide quality care and to find the best care from professionals  Legal, financial and living-arrangement decisions Our Helpline also features: Confidential care consultation provided by master's level clinicians who can help with decision-making support, crisis assistance and education on issues families face every day  Help in a caller's preferred language using our translation service that features more than 200 languages and dialects  Referrals to local community programs, services and ongoing support     FALL PRECAUTIONS: Be cautious when walking. Scan the area for obstacles that may increase the risk of trips and falls. When getting up in the mornings, sit up at the edge of the bed for a few minutes before getting out of bed. Consider elevating the bed at the head end to avoid drop of blood pressure when getting up. Walk always in a well-lit room (use night lights in the walls). Avoid area rugs or power cords from appliances in the middle of the walkways. Use a walker or a cane if necessary and consider physical therapy for balance exercise. Get your eyesight checked regularly.  FINANCIAL OVERSIGHT:  Supervision, especially oversight when making financial decisions or transactions is also recommended.  HOME SAFETY: Consider the safety of the kitchen when operating appliances like stoves, microwave oven, and blender. Consider having supervision and share cooking responsibilities until no longer able to participate in those. Accidents with firearms and other hazards in the house should be identified and addressed as well.   ABILITY TO BE LEFT ALONE: If patient is unable to contact 911 operator, consider using LifeLine, or when the need is there, arrange for someone to stay with patients. Smoking is a fire hazard, consider supervision or cessation. Risk of wandering should be assessed by caregiver and if detected at any point, supervision and safe proof recommendations should be instituted.  MEDICATION SUPERVISION: Inability to self-administer medication needs to be constantly addressed. Implement a mechanism to ensure safe administration of the medications.   DRIVING: Regarding driving, in patients with progressive memory problems, driving will be impaired. We advise to have someone else do the driving if trouble finding directions or if minor accidents are reported. Independent driving assessment is available to determine safety of driving.   If you are interested in the driving assessment, you can contact the following:  [employer] in [place] [phone]  [facility] [phone]  [facility] [phone] or [phone]      Mediterranean Diet A Mediterranean diet refers to food and lifestyle choices that are based on the traditions of countries located on  the [place] Sea. This way of eating has been shown to help prevent certain conditions and improve outcomes for people who have chronic diseases, like kidney disease and heart disease. What are tips for following this plan? Lifestyle  Cook and eat  meals together with your family, when possible. Drink enough fluid to keep your urine clear or pale yellow. Be physically active every day. This includes: Aerobic exercise like running or swimming. Leisure activities like gardening, walking, or housework. Get 7-8 hours of sleep each night. If recommended by your health care provider, drink red wine in moderation. This means 1 glass a day for nonpregnant women and 2 glasses a day for men. A glass of wine equals 5 oz (150 mL). Reading food labels  Check the serving size of packaged foods. For foods such as rice and pasta, the serving size refers to the amount of cooked product, not dry. Check the total fat in packaged foods. Avoid foods that have saturated fat or trans fats. Check the ingredients list for added sugars, such as corn syrup. Shopping  At the grocery store, buy most of your food from the areas near the walls of the store. This includes: Fresh fruits and vegetables (produce). Grains, beans, nuts, and seeds. Some of these may be available in unpackaged forms or large amounts (in bulk). Fresh seafood. Poultry and eggs. Low-fat dairy products. Buy whole ingredients instead of prepackaged foods. Buy fresh fruits and vegetables in-season from local farmers markets. Buy frozen fruits and vegetables in resealable bags. If you do not have access to quality fresh seafood, buy precooked frozen shrimp or canned fish, such as tuna, salmon, or sardines. Buy small amounts of raw or cooked vegetables, salads, or olives from the deli or salad bar at your store. Stock your pantry so you always have certain foods on hand, such as olive oil, canned tuna, canned tomatoes, rice, pasta, and beans. Cooking  Cook foods with extra-virgin olive oil instead of using butter or other vegetable oils. Have meat as a side dish, and have vegetables or grains as your main dish. This means having meat in small portions or adding small amounts of meat to foods like  pasta or stew. Use beans or vegetables instead of meat in common dishes like chili or lasagna. Experiment with different cooking methods. Try roasting or broiling vegetables instead of steaming or sauteing them. Add frozen vegetables to soups, stews, pasta, or rice. Add nuts or seeds for added healthy fat at each meal. You can add these to yogurt, salads, or vegetable dishes. Marinate fish or vegetables using olive oil, lemon juice, garlic, and fresh herbs. Meal planning  Plan to eat 1 vegetarian meal one day each week. Try to work up to 2 vegetarian meals, if possible. Eat seafood 2 or more times a week. Have healthy snacks readily available, such as: Vegetable sticks with hummus. Greek yogurt. Fruit and nut trail mix. Eat balanced meals throughout the week. This includes: Fruit: 2-3 servings a day Vegetables: 4-5 servings a day Low-fat dairy: 2 servings a day Fish, poultry, or lean meat: 1 serving a day Beans and legumes: 2 or more servings a week Nuts and seeds: 1-2 servings a day Whole grains: 6-8 servings a day Extra-virgin olive oil: 3-4 servings a day Limit red meat and sweets to only a few servings a month What are my food choices? Mediterranean diet Recommended Grains: Whole-grain pasta. Brown rice. Bulgar wheat. Polenta. Couscous. Whole-wheat bread. Orpah Cobb. Vegetables: Artichokes. Beets. Broccoli. Cabbage.  Carrots. Eggplant. Green beans. Chard. Kale. Spinach. Onions. Leeks. Peas. Squash. Tomatoes. Peppers. Radishes. Fruits: Apples. Apricots. Avocado. Berries. Bananas. Cherries. Dates. Figs. Grapes. Lemons. Melon. Oranges. Peaches. Plums. Pomegranate. Meats and other protein foods: Beans. Almonds. Sunflower seeds. Pine nuts. Peanuts. Cod. Salmon. Scallops. Shrimp. Tuna. Tilapia. Clams. Oysters. Eggs. Dairy: Low-fat milk. Cheese. Greek yogurt. Beverages: Water. Red wine. Herbal tea. Fats and oils: Extra virgin olive oil. Avocado oil. Grape seed oil. Sweets and  desserts: [place] yogurt with honey. Baked apples. Poached pears. Trail mix. Seasoning and other foods: Basil. Cilantro. Coriander. Cumin. Mint. Parsley. Sage. Rosemary. Tarragon. Garlic. Oregano. Thyme. Pepper. Balsalmic vinegar. Tahini. Hummus. Tomato sauce. Olives. Mushrooms. Limit these Grains: Prepackaged pasta or rice dishes. Prepackaged cereal with added sugar. Vegetables: Deep fried potatoes (french fries). Fruits: Fruit canned in syrup. Meats and other protein foods: Beef. Pork. Lamb. Poultry with skin. Hot dogs. Tomasa Blase. Dairy: Ice cream. Sour cream. Whole milk. Beverages: Juice. Sugar-sweetened soft drinks. Beer. Liquor and spirits. Fats and oils: Butter. Canola oil. Vegetable oil. Beef fat (tallow). Lard. Sweets and desserts: Cookies. Cakes. Pies. Candy. Seasoning and other foods: Mayonnaise. Premade sauces and marinades. The items listed may not be a complete list. Talk with your dietitian about what dietary choices are right for you. Summary The Mediterranean diet includes both food and lifestyle choices. Eat a variety of fresh fruits and vegetables, beans, nuts, seeds, and whole grains. Limit the amount of red meat and sweets that you eat. Talk with your health care provider about whether it is safe for you to drink red wine in moderation. This means 1 glass a day for nonpregnant women and 2 glasses a day for men. A glass of wine equals 5 oz (150 mL). This information is not intended to replace advice given to you by your health care provider. Make sure you discuss any questions you have with your health care provider. Document Released: 04/06/2016 Document Revised: 05/09/2016 Document Reviewed: 04/06/2016 Elsevier Interactive Patient Education  2017 [employer].    Labs today [address]

## 2023-01-24 NOTE — Progress Notes (Signed)
B12 and thyroid levels are normal. Thanks

## 2023-01-25 ENCOUNTER — Telehealth: Payer: Self-pay

## 2023-01-30 ENCOUNTER — Ambulatory Visit: Payer: Medicare Other | Admitting: Psychology

## 2023-01-30 ENCOUNTER — Ambulatory Visit (INDEPENDENT_AMBULATORY_CARE_PROVIDER_SITE_OTHER): Payer: Medicare Other | Admitting: Psychology

## 2023-01-30 ENCOUNTER — Encounter: Payer: Self-pay | Admitting: Psychology

## 2023-01-30 DIAGNOSIS — E039 Hypothyroidism, unspecified: Secondary | ICD-10-CM | POA: Insufficient documentation

## 2023-01-30 DIAGNOSIS — G3184 Mild cognitive impairment, so stated: Secondary | ICD-10-CM | POA: Diagnosis not present

## 2023-01-30 DIAGNOSIS — E782 Mixed hyperlipidemia: Secondary | ICD-10-CM | POA: Insufficient documentation

## 2023-01-30 DIAGNOSIS — R4189 Other symptoms and signs involving cognitive functions and awareness: Secondary | ICD-10-CM

## 2023-01-30 DIAGNOSIS — G4733 Obstructive sleep apnea (adult) (pediatric): Secondary | ICD-10-CM | POA: Insufficient documentation

## 2023-01-30 HISTORY — DX: Mild cognitive impairment of uncertain or unknown etiology: G31.84

## 2023-01-30 NOTE — Progress Notes (Unsigned)
NEUROPSYCHOLOGICAL EVALUATION [place]. [facility] Department of Neurology  Date of Evaluation: January 30, 2023  Reason for Referral:   Melanie Smith is a 82 y.o. right-handed Caucasian female referred by Marlowe Kays, PA-C, to characterize her current cognitive functioning and assist with diagnostic clarity and treatment planning in the context of subjective memory decline.   Assessment and Plan:   Clinical Impression(s): Melanie Smith pattern of performance is suggestive of severe impairment surrounding all aspects of learning and memory. Additional performance variability was exhibited across visuospatial abilities. Performances were appropriate relative to age-matched peers across all other assessed cognitive domains. This includes processing speed, attention/concentration, cognitive flexibility, and both receptive and expressive language. Melanie Smith denied difficulties completing instrumental activities of daily living (ADLs) independently. Her husband noted taking a more active role in financial management and bill paying but was otherwise in agreement. As such, given evidence for cognitive dysfunction described above, she meets criteria for a Mild Neurocognitive Disorder ("mild cognitive impairment") at the present time.  Regarding etiology, ***.   Recommendations: ***  A repeat neuropsychological evaluation in 12-18 months (or sooner if functional decline is noted) is recommended to assess the trajectory of future cognitive decline should it occur. This will also aid in future efforts towards improved diagnostic clarity.  Melanie Smith has already been prescribed a medication aimed to address memory loss and concerns surrounding Alzheimer's disease (i.e., ***). she is encouraged to continue taking this medication as prescribed. It is important to highlight that this medication has been shown to slow functional decline in some individuals. There is no current treatment which  can stop or reverse cognitive decline when caused by a neurodegenerative illness.   A combination of medication and psychotherapy has been shown to be most effective at treating symptoms of anxiety and depression. As such, Melanie Smith is encouraged to speak with her prescribing physician regarding medication adjustments to optimally manage these symptoms. Likewise, Melanie Smith is encouraged to consider engaging in short-term psychotherapy to address symptoms of psychiatric distress. she would benefit from an active and collaborative therapeutic environment, rather than one purely supportive in nature. Recommended treatment modalities include Cognitive Behavioral Therapy (CBT) or Acceptance and Commitment Therapy (ACT).  Performance across neurocognitive testing is not a strong predictor of an individual's safety operating a motor vehicle. Should her family wish to pursue a formalized driving evaluation, they could reach out to the following agencies: [employer] in [place]: [phone] [facility]: [phone] [facility]: [phone] [facility]: [phone] or [phone]  Should there be progression of current deficits over time, Melanie Smith is unlikely to regain any independent living skills lost. Therefore, it is recommended that she remain as involved as possible in all aspects of household chores, finances, and medication management, with supervision to ensure adequate performance. she will likely benefit from the establishment and maintenance of a routine in order to maximize her functional abilities over time.  It will be important for Melanie Smith to have another person with her when in situations where she may need to process information, weigh the pros and cons of different options, and make decisions, in order to ensure that she fully understands and recalls all information to be considered.  If not already done, Melanie Smith and her family may want to  discuss her wishes regarding durable power of attorney and medical decision making, so that she can have input into these choices. If they require legal assistance with this, long-term care resource  access, or other aspects of estate planning, they could reach out to [employer] at [phone] for a free consultation. Additionally, they may wish to discuss future plans for caretaking and seek out community options for in home/residential care should they become necessary.  Melanie Smith is encouraged to attend to lifestyle factors for brain health (e.g., regular physical exercise, good nutrition habits and consideration of the MIND-DASH diet, regular participation in cognitively-stimulating activities, and general stress management techniques), which are likely to have benefits for both emotional adjustment and cognition. In fact, in addition to promoting good general health, regular exercise incorporating aerobic activities (e.g., brisk walking, jogging, cycling, etc.) has been demonstrated to be a very effective treatment for depression and stress, with similar efficacy rates to both antidepressant medication and psychotherapy.   Optimal control of vascular risk factors (including safe cardiovascular exercise and adherence to dietary recommendations) is encouraged. Likewise, continued compliance with her CPAP machine will also be important. ***  Continued participation in activities which provide mental stimulation and social interaction is also recommended.   If interested, there are some activities which have therapeutic value and can be useful in keeping her cognitively stimulated. For suggestions, Melanie Smith is encouraged to go to the following website: https://www.[facility].org/get-to-know-barrow/centers-programs/neurorehabilitation-center/neuro-rehab-apps-and-games/ which has options, categorized by level of difficulty. It should be noted that these activities should not be viewed as a substitute  for therapy.  Important information should be provided to Melanie Smith in written format in all instances. This information should be placed in a highly frequented and easily visible location within her home to promote recall. External strategies such as written notes in a consistently used memory journal, visual and nonverbal auditory cues such as a calendar on the refrigerator or appointments with alarm, such as on a cell phone, can also help maximize recall.  Memory can be improved using internal strategies such as rehearsal, repetition, chunking, mnemonics, association, and imagery. External strategies such as written notes in a consistently used memory journal, visual and nonverbal auditory cues such as a calendar on the refrigerator or appointments with alarm, such as on a cell phone, can also help maximize recall.    When learning new information, she would benefit from information being broken up into small, manageable pieces. she may also find it helpful to articulate the material in her own words and in a context to promote encoding at the onset of a new task. This material may need to be repeated multiple times to promote encoding.  Because she shows better recall for structured information, she will likely understand and retain new information better if it is presented to her in a meaningful or well-organized manner at the outset, such as grouping items into meaningful categories or presenting information in an outlined, bulleted, or story format.  To address problems with processing speed, she may wish to consider:   -Ensuring that she is alerted when essential material or instructions are being presented   -Adjusting the speed at which new information is presented   -Allowing for more time in comprehending, processing, and responding in conversation   -Repeating and paraphrasing instructions or conversations aloud  To address problems with fluctuating attention and/or executive dysfunction,  she may wish to consider:   -Avoiding external distractions when needing to concentrate   -Limiting exposure to fast paced environments with multiple sensory demands   -Writing down complicated information and using checklists   -Attempting and completing one task at a time (i.e., no multi-tasking)   -Verbalizing  aloud each step of a task to maintain focus   -Taking frequent breaks during the completion of steps/tasks to avoid fatigue   -Reducing the amount of information considered at one time   -Scheduling more difficult activities for a time of day where she is usually most alert  {ZCMLanguageRecs:22763}  Review of Records:   Ms. Dexheimer was seen by [facility] Marlowe Kays, PA-C) on 01/24/2023 for an evaluation of memory loss. At that time, Ms. Bringman described subjective memory decline over the prior three years. Her husband reported some dysfunction during the past year or so. She described some instances where she might have become more repetitive in conversation. ADLs were largely described as intact. Performance on a brief cognitive screening instrument (MOCA) was 21/30. Ultimately, Ms. Heitmeyer was referred for a comprehensive neuropsychological evaluation to characterize her cognitive abilities and to assist with diagnostic clarity and treatment planning.   An amyloid PET scan on 10/29/2022 revealed positive cortical activity/amyloid deposition throughout the frontal, parietal, temporal, and occipital lobes. Brain MRI on 12/21/2022 was unremarkable outside of mild microvascular ischemic disease. PET and MRI scans were performed at outside facilities and unable to be personally viewed. Prior APOE testing revealed a 3/3 genotype.   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/2007   Degeneration of cervical intervertebral disc     Degeneration of lumbar or lumbosacral intervertebral disc 12/09/2004   Diverticulosis    Dysphagia 02/15/2015   Enthesopathy of hip region    Full incontinence of feces    Functional urinary incontinence 12/26/2016   Gastroesophageal reflux disease without esophagitis 02/15/2000   Generalized osteoarthrosis, involving multiple sites    Heart murmur 05/27/2019   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 10/29/2019   Hypertension, benign 03/25/2018   Leg swelling 02/24/2018   Lumbar back pain with radiculopathy affecting right lower extremity 01/08/2018   Major depressive disorder 11/25/2001   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   Mild aortic regurgitation 07/06/2017   Mild mitral regurgitation 07/06/2017   Mixed hyperlipidemia    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   OSA (obstructive sleep apnea)    Primary osteoarthritis of both knees 03/13/2016   Right leg pain 02/24/2018   Sacroiliitis 02/24/2019   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   50-69 % Doppler 05/02/2019   Thrombocytopenia 10/03/2016   Urinary calculus 02/18/2001   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    Past Surgical History:  Procedure Laterality Date   INGUINAL HERNIA REPAIR Right     Current Outpatient Medications:    Calcium-Magnesium-Vitamin D (CALCIUM MAGNESIUM PO), Take 1 tablet by mouth daily., Disp: , Rfl:    Cyanocobalamin (VITAMIN B-12 PO), Take by mouth daily., Disp: , Rfl:    cyclobenzaprine (FLEXERIL) 5 MG tablet, Take 5 mg by mouth daily as needed for muscle spasms., Disp: , Rfl:    dicyclomine (BENTYL) 20 MG tablet, Take 1 tablet (20 mg total) by mouth every morning., Disp: 30 tablet, Rfl: 1   etodolac (LODINE) 500 MG tablet, Take 500 mg by mouth 2 (two) times daily., Disp: , Rfl:    hydroxychloroquine (PLAQUENIL) 200 MG tablet, Take 200 mg by mouth 2 (two) times daily., Disp: , Rfl:    levothyroxine (SYNTHROID, LEVOTHROID) 75 MCG tablet, Take 75 mcg by  mouth daily before breakfast., Disp: , Rfl:  memantine (NAMENDA) 10 MG tablet, Take 1 tablet (10 mg total) by mouth 2 (two) times daily., Disp: 180 each, Rfl: 3   Probiotic Product (PROBIOTIC DAILY PO), Take 1 tablet by mouth daily., Disp: , Rfl:    simvastatin (ZOCOR) 40 MG tablet, Take 40 mg by mouth every evening., Disp: , Rfl:    sucralfate (CARAFATE) 1 G tablet, Take 1 tablet (1 g total) by mouth 2 (two) times daily., Disp: 60 tablet, Rfl: 1  Clinical Interview:   The following information was obtained during a clinical interview with Ms. Hibbert and her husband prior to cognitive testing.  Cognitive Symptoms: Decreased short-term memory: Endorsed. Ms. Kinstler described her memory capabilities as "toast" and provided a further metaphor surrounding an individual taking a whisk and "scrambling" her brain. She was largely unable to provide any specific examples of day-to-day memory loss outside of stating that her sister previously commented that it was "not like it used to be." She commented that she is still able to read a novel and remember previously read information when stopping and picking it back up. Her husband noted some trouble recalling recent conversations and being more prone to misplacing objects in her environment. Per Ms. Siravo, difficulties were said to have progressively decline over the past 2-3 years. Her husband reported some change observable during the past year.  Decreased long-term memory: Endorsed. She again did not provide any specific examples, only stating that both short- and long-term memory were "toast" and exhibited great difficulty.  Decreased attention/concentration: Endorsed. She described "perhaps a little bit" when asked about ongoing trouble with sustained attention and increased distractibility.  Reduced processing speed: Denied. Difficulties with executive functions: Endorsed. She noted that organization and multi-tasking seem far more effortful and challenging  to complete and keep up with. She denied trouble with impulsivity and her husband denied any observed personality changes.  Difficulties with emotion regulation: Denied. Difficulties with receptive language: Denied. Difficulties with word finding: Denied. Decreased visuoperceptual ability: Denied.  Difficulties completing ADLs: Largely denied. She continues to be independent with medication management. Her husband was in agreement and expressed no concerns. Her husband manages finances and bill paying. While this is somewhat longstanding in nature, he did express that he has taken more complete control over the past few years due to concerns about the potential for mistakes to be made. Ms. Lorraine continues to drive without difficulty.   Additional Medical History: History of traumatic brain injury/concussion: Denied. History of stroke: Denied. History of seizure activity: Denied. History of known exposure to toxins: Denied. Symptoms of chronic pain: Endorsed. She reported arthritic pain and has underwent bilateral knee replacement procedures in the past. Medical records also suggest prior back pain concerns.  Experience of frequent headaches/migraines: Denied. Frequent instances of dizziness/vertigo: Denied.  Sensory changes: She wears reading glasses with benefit. Other sensory changes/difficulties (e.g., hearing, taste, smell) were denied.  Balance/coordination difficulties: She acknowledged that her balance is "not as good as it once was" and noted that she is more cautious while walking to prevent falls. No recent falls were reported and no prominent balance concerns were described.  Other motor difficulties: Denied.  Sleep History: Estimated hours obtained each night: 7-8 hours.  Difficulties falling asleep: Denied. Difficulties staying asleep: Denied. Feels rested and refreshed upon awakening: Endorsed.  History of snoring: Denied. History of waking up gasping for air:  Denied. Witnessed breath cessation while asleep: Denied. However, medical records do suggest a prior obstructive sleep apnea diagnosis. I am unaware of  where this diagnosis originated from.   History of vivid dreaming: Denied. Excessive movement while asleep: Denied. Instances of acting out her dreams: Denied.  Psychiatric/Behavioral Health History: Depression: She described her current mood as "a little low" and acknowledged that some of this distress was related to concerns surrounding memory and cognitive decline. She also alluded to a more longstanding history of depressive symptoms due to unspecified stressors. She denied current or remote suicidal ideation, intent, or plan.  Anxiety: Denied. Mania: Denied. Trauma History: Denied. Visual/auditory hallucinations: Denied. Delusional thoughts: Denied.  Tobacco: Denied. Alcohol: She denied current alcohol consumption as well as a history of problematic alcohol abuse or dependence.  Recreational drugs: Denied.  Family History: Problem Relation Age of Onset   Heart disease Father    Heart disease Mother    Heart disease Brother    This information was confirmed by Ms. Stanek.  Academic/Vocational History: Highest level of educational attainment: 18 years. She earned a Energy manager degree in [place] and her Master's degree in education. She described herself as a good (A/B) student in academic settings. Math and science courses were said to represent relative weaknesses.  History of developmental delay: Denied. History of grade repetition: Denied. Enrollment in special education courses: Denied. History of LD/ADHD: Denied.  Employment: Retired. She previously worked as a Runner, broadcasting/film/video.   Evaluation Results:   Behavioral Observations: Ms. Boutilier was accompanied by her husband, arrived to her appointment on time, and was appropriately dressed and groomed. She appeared alert and oriented. Observed gait and station were within normal limits.  Gross motor functioning appeared intact upon informal observation and no abnormal movements (e.g., tremors) were noted. Her affect was generally relaxed and positive. Spontaneous speech was fluent and word finding difficulties were not observed during the clinical interview. Thought processes were coherent, organized, and normal in content. Insight into her cognitive difficulties appeared adequate.   During testing, sustained attention was appropriate. Task engagement was adequate and she persisted when challenged. She fatigued as the evaluation progressed and also commented on developing a "splitting headache." The evaluation was abbreviated in response. Overall, Ms. Schaak was cooperative with the clinical interview and subsequent testing procedures.   Adequacy of Effort: The validity of neuropsychological testing is limited by the extent to which the individual being tested may be assumed to have exerted adequate effort during testing. Ms. Ardito expressed her intention to perform to the best of her abilities and exhibited adequate task engagement and persistence. Scores across stand-alone and embedded performance validity measures were within expectation. As such, the results of the current evaluation are believed to be a valid representation of Ms. Hainer's current cognitive functioning.  Test Results: Ms. Candito was *** oriented at the time of the current evaluation. Points were lost for ***.  Intellectual abilities based upon educational and vocational attainment were estimated to be in the average to above average range. Premorbid abilities were estimated to be within the {ZCMNormDescription:22759} range based upon a single-word reading test.   Processing speed was ***. Basic attention was ***. More complex attention (e.g., working memory) was ***. Executive functioning was ***.  Assessed receptive language abilities were ***. Likewise, Ms. Mcconaghy did not exhibit any difficulties comprehending task  instructions and answered all questions asked of her appropriately. Assessed expressive language (e.g., verbal fluency and confrontation naming) was ***.     Assessed visuospatial/visuoconstructional abilities were ***.    Learning (i.e., encoding) of novel verbal and visual information was ***. Spontaneous delayed recall (i.e., retrieval) of previously  learned information was ***. Retention rates were ***% across a story learning task, ***% across a list learning task, and ***% across a shape learning task. Performance across recognition tasks was ***, suggesting *** evidence for information consolidation.   Results of emotional screening instruments suggested that recent symptoms of generalized anxiety were in the *** range, while symptoms of depression were within the *** range. A screening instrument assessing recent sleep quality suggested the presence of *** sleep dysfunction.  Tables of Scores:   Note: This summary of test scores accompanies the interpretive report and should not be considered in isolation without reference to the appropriate sections in the text. Descriptors are based on appropriate normative data and may be adjusted based on clinical judgment. Terms such as "Within Normal Limits" and "Outside Normal Limits" are used when a more specific description of the test score cannot be determined.       Percentile - Normative Descriptor > 98 - Exceptionally High 91-97 - Well Above Average 75-90 - Above Average 25-74 - Average 9-24 - Below Average 2-8 - Well Below Average < 2 - Exceptionally Low       Orientation:      Raw Score Percentile   NAB Orientation, Form 1 20/29 --- ---       Cognitive Screening:      Raw Score Percentile   SLUMS: 17/30 --- ---       RBANS, Form A: Standard Score/ Scaled Score Percentile   Total Score 82 12 Below Average  Immediate Memory 61 <1 Exceptionally Low    List Learning 3 1 Exceptionally Low    Story Memory 4 2 Well Below Average   Visuospatial/Constructional 100 50 Average    Figure Copy 14 91 Well Above Average    Line Orientation 12/20 3-9 Well Below Average  Language 103 58 Average    Picture Naming 9/10 26-50 Average    Semantic Fluency 10 50 Average  Attention 115 84 Above Average    Digit Span 14 91 Well Above Average    Coding 11 63 Average  Delayed Memory 52 <1 Exceptionally Low    List Recall 0/10 <2 Exceptionally Low    List Recognition 16/20 3-9 Well Below Average    Story Recall 3 1 Exceptionally Low    Story Recognition 7/12 7-13 Well Below Average to Below Average    Figure Recall 1 <1 Exceptionally Low    Figure Recognition 0/8 <1 Exceptionally Low        Intellectual Functioning:      Standard Score Percentile   Test of Premorbid Functioning: 117 87 Above Average       Attention/Executive Function:     Trail Making Test (TMT): Raw Score (T Score) Percentile     Part A 38 secs.,  0 errors (49) 46 Average    Part B 93 secs.,  1 error (49) 46 Average         Scaled Score Percentile   WAIS-IV Digit Span: 11 63 Average    Forward 14 91 Well Above Average    Backward 8 25 Average    Sequencing 10 50 Average       Language:     Verbal Fluency Test: Raw Score (Scaled Score) Percentile     Phonemic Fluency (CFL) 37 (11) 63 Average    Category Fluency 39 (11) 63 Average  *Based on [facility] Older Normative Studies (MOANS)          NAB Language Module, Form 1:  T Score Percentile     Auditory Comprehension 57 75 Above Average    Naming 29/31 (51) 54 Average       Visuospatial/Visuoconstruction:      Raw Score Percentile   Clock Drawing: 5/10 --- Impaired       Mood and Personality:      Raw Score Percentile   PROMIS Depression Questionnaire: 8 --- None to Slight  PROMIS Anxiety Questionnaire: 7 --- None to Slight       Additional Questionnaires:      Raw Score Percentile   PROMIS Sleep Disturbance Questionnaire: 8 --- None to Slight   Informed Consent and Coding/Compliance:   The  current evaluation represents a clinical evaluation for the purposes previously outlined by the referral source and is in no way reflective of a forensic evaluation.   Ms. Duplantis was provided with a verbal description of the nature and purpose of the present neuropsychological evaluation. Also reviewed were the foreseeable risks and/or discomforts and benefits of the procedure, limits of confidentiality, and mandatory reporting requirements of this provider. The patient was given the opportunity to ask questions and receive answers about the evaluation. Oral consent to participate was provided by the patient.   This evaluation was conducted by Newman Nickels, Ph.D., ABPP-CN, board certified clinical neuropsychologist. Ms. Helmly completed a clinical interview with Dr. Milbert Coulter, billed as one unit [phone], and 115 minutes of cognitive testing and scoring, billed as one unit [phone] and three additional units [id number]. Psychometrist Wallace Keller, B.S., assisted Dr. Milbert Coulter with test administration and scoring procedures. As a separate and discrete service, one unit [id number] and two units [phone] were billed for Dr. Tammy Sours time spent in interpretation and report writing.

## 2023-01-30 NOTE — Progress Notes (Signed)
   Psychometrician Note   Cognitive testing was administered to Melanie Smith by Wallace Keller, B.S. (psychometrist) under the supervision of Dr. Newman Nickels, Ph.D., licensed psychologist on 01/30/2023. Melanie Smith did not appear overtly distressed by the testing session per behavioral observation or responses across self-report questionnaires. Rest breaks were offered.    The battery of tests administered was selected by Dr. Newman Nickels, Ph.D. with consideration to Melanie Smith's current level of functioning, the nature of her symptoms, emotional and behavioral responses during interview, level of literacy, observed level of motivation/effort, and the nature of the referral question. This battery was communicated to the psychometrist. Communication between Dr. Newman Nickels, Ph.D. and the psychometrist was ongoing throughout the evaluation and Dr. Newman Nickels, Ph.D. was immediately accessible at all times. Dr. Newman Nickels, Ph.D. provided supervision to the psychometrist on the date of this service to the extent necessary to assure the quality of all services provided.    Melanie Smith will return within approximately 1-2 weeks for an interactive feedback session with Dr. Milbert Coulter at which time her test performances, clinical impressions, and treatment recommendations will be reviewed in detail. Melanie Smith understands she can contact our office should she require our assistance before this time.  A total of 115 minutes of billable time were spent face-to-face with Melanie Smith by the psychometrist. This includes both test administration and scoring time. Billing for these services is reflected in the clinical report generated by Dr. Newman Nickels, Ph.D.  This note reflects time spent with the psychometrician and does not include test scores or any clinical interpretations made by Dr. Milbert Coulter. The full report will follow in a separate note.

## 2023-01-31 ENCOUNTER — Encounter: Payer: Self-pay | Admitting: Psychology

## 2023-02-06 ENCOUNTER — Ambulatory Visit (INDEPENDENT_AMBULATORY_CARE_PROVIDER_SITE_OTHER): Payer: Medicare Other | Admitting: Psychology

## 2023-02-06 DIAGNOSIS — G3184 Mild cognitive impairment, so stated: Secondary | ICD-10-CM | POA: Diagnosis not present

## 2023-02-06 NOTE — Progress Notes (Signed)
   Neuropsychology Feedback Session Melanie Smith. [facility] Department of Neurology  Reason for Referral:   Melanie Smith is a 82 y.o. right-handed Caucasian female referred by Marlowe Kays, PA-C, to characterize her current cognitive functioning and assist with diagnostic clarity and treatment planning in the context of subjective memory decline.   Feedback:   Melanie Smith completed a comprehensive neuropsychological evaluation on 01/30/2023. Please refer to that encounter for the full report and recommendations. Briefly, results suggested severe impairment surrounding all aspects of learning and memory. Additional performance variability was exhibited across visuospatial abilities. Performances were appropriate relative to age-matched peers across all other assessed cognitive domains. The etiology for ongoing memory impairment is unclear at the present time. With that being said, memory patterns are worrisome for early stages of underlying Alzheimer's disease. Across memory testing, Melanie Smith did not benefit from repeated exposure to new information across learning trials, was essentially amnestic (i.e., 0% to 20% retention) after a brief delay across all memory tasks, and performed poorly across yes/no recognition trials. Taken together, this suggests concerns for rapid forgetting and a prominent storage impairment, both of which are the hallmark testing patterns for this illness. Visuospatial variability can also be seen with typical disease progression. 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 Strong performances across confrontation naming and semantic fluency tasks are encouraging and likely suggest that she remains in earlier stages of this illness if truly present.   Melanie Smith was accompanied by her husband during the current feedback session. Content of the current session focused on the results of her  neuropsychological evaluation. Melanie Smith was given the opportunity to ask questions and her questions were answered. She was encouraged to reach out should additional questions arise. A copy of her report was provided at the conclusion of the visit.      One unit [phone] was billed for Dr. Tammy Sours time spent preparing for, conducting, and documenting the current feedback session with Melanie Smith.

## 2023-03-16 NOTE — Telephone Encounter (Signed)
Close encounter 

## 2023-07-06 ENCOUNTER — Telehealth: Payer: Self-pay | Admitting: Physician Assistant

## 2023-07-06 NOTE — Telephone Encounter (Signed)
Patient Melanie Smith on V 07/05/23 that she has  been calling for a few days now and no one has returned her call. Her sister has called as well. I called and Melanie Smith. I am documenting I called and Melanie Smith

## 2023-07-06 NOTE — Telephone Encounter (Signed)
I left voicemail, never received a message that she called When she calls back please ask what is needed. Thank you, in case I am in clinic.

## 2023-07-09 NOTE — Telephone Encounter (Signed)
I left detailed message to call office back, if she still needs again. Multple messages left on voicemail

## 2023-07-30 ENCOUNTER — Ambulatory Visit: Payer: Medicare Other | Admitting: Physician Assistant

## 2023-08-21 ENCOUNTER — Ambulatory Visit: Payer: Medicare Other | Admitting: Physician Assistant

## 2023-08-27 ENCOUNTER — Encounter: Payer: Self-pay | Admitting: Physician Assistant

## 2023-08-27 ENCOUNTER — Ambulatory Visit (INDEPENDENT_AMBULATORY_CARE_PROVIDER_SITE_OTHER): Payer: Medicare Other | Admitting: Physician Assistant

## 2023-08-27 VITALS — BP 150/77 | HR 66 | Ht 63.0 in | Wt 147.0 lb

## 2023-08-27 DIAGNOSIS — G309 Alzheimer's disease, unspecified: Secondary | ICD-10-CM

## 2023-08-27 DIAGNOSIS — R413 Other amnesia: Secondary | ICD-10-CM | POA: Diagnosis not present

## 2023-08-27 DIAGNOSIS — R4189 Other symptoms and signs involving cognitive functions and awareness: Secondary | ICD-10-CM

## 2023-08-27 DIAGNOSIS — G3184 Mild cognitive impairment, so stated: Secondary | ICD-10-CM | POA: Diagnosis not present

## 2023-08-27 DIAGNOSIS — F0280 Dementia in other diseases classified elsewhere without behavioral disturbance: Secondary | ICD-10-CM

## 2023-08-27 NOTE — Progress Notes (Signed)
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 On October 29, 2022  PET/CT showed positive cortical activity/amyloid deposition throughout the frontal, parietal, temporal and occipital lobes with loss of gray-white matter contrast.  MRI performed at [facility] in [facility] 12/21/2022  remarkable  for chronic small vessel disease seen today in follow up for memory loss. She had a neuropsychological evaluation on 02/06/23 yielding a diagnosis of Amnestic MCI likely due to  Alzheimer's disease. Patient is currently on memantine 10 mg bid, tolerating well. MMSE today is 25/30. Patient is able to participate on his ADLs and to drive without difficulties.  Discussed referring her to a tertiary center [facility]) for further evaluation an d therapeutic options. She agreed to proceed.      Follow up in 6  months. Continue Memantine 10 mg twice daily. Side effects were discussed   Referral to [facility] for evaluation of other therapeutic options.  Repeat neuropsych evaluation in 6-12 months for diagnostic clarity and disease trajectory Recommend good control of her cardiovascular risk factors Continue to control mood as per PCP     Subjective:    This patient is accompanied in the office by her husband  who supplements the history.  Previous records as well as any outside records available were reviewed prior to todays visit. Patient was last seen on 01/24/23 with MMSE 21/30.     Any changes in memory since last visit? " I don't have a sense that I lost more territory since las visit". Husband denies any memory changes and so does her sister in [place]. likes to do crossword puzzles and  word finding,  plays computer games, reading and watching the news. repeats oneself?  Endorsed Disoriented when walking into a room?  Patient denies    Leaving objects?  May misplace things but not in unusual places   Wandering behavior?  denies   Any personality changes since last visit?  denies   Any worsening depression?:  Denies.   Hallucinations or paranoia?  Denies.   Seizures? denies    Any sleep changes?  Sleep very well. Denies vivid dreams, REM behavior or sleepwalking   Sleep apnea?   Denies.   Any hygiene concerns? Denies.  Independent of bathing and dressing?  Endorsed  Does the patient needs help with medications?  Patient is in charge   Who is in charge of the finances? Patient is in charge, she has her own checking account and 2 credit cards.      Any changes in appetite?  Eats well.  Patient have trouble swallowing? She has sometimes a hard time with swallowing pills, due to esophageal stricture.  Does the patient cook? Yes, denies forgetting common recipes, denies any accidents in the kitchen Any headaches?   Denies.   Chronic back pain  denies.  Ambulates with difficulty? Denies.  She uses a walking stick when there are hills on her trail.  Recent falls or head injuries? denies     Unilateral weakness, numbness or tingling? denies   Any tremors?  Denies   Any anosmia?  Denies   Any incontinence of urine? Denies  Any bowel dysfunction?   Denies      Patient lives with husband,  son lives nearby and monitors frequently.  Does the patient drive? No longer drives, for the last 4 months, family concerned and does not want her to drive.     Neuropsych evaluation 02/06/23 Briefly, results suggested severe impairment surrounding all aspects of learning and memory. Additional performance variability was exhibited across visuospatial abilities. Performances were appropriate relative to age-matched peers across all other assessed cognitive domains. The etiology for ongoing memory impairment is  unclear at the present time. With that being said, memory patterns are worrisome for early stages of underlying Alzheimer's disease. Across memory testing, [name] did not benefit from repeated exposure to new information across learning trials, was essentially amnestic (i.e., 0% to 20% retention) after a brief delay across all memory tasks, and performed poorly across yes/no recognition trials. Taken together, this suggests concerns for rapid forgetting and a prominent storage impairment, both of which are the hallmark testing patterns for this illness. Visuospatial variability can also be seen with typical disease progression. 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 Strong performances across confrontation naming and semantic fluency tasks are encouraging and likely suggest that she remains in earlier stages of this illness if truly present.   Initial visit 01/24/23  How long did patient have memory difficulties? "Can't give you an absolute answer but has been present in my mind  for at least 3 years with  my husband noticed for the last year. He is hard of hearing so my conversations with him are not lengthy, my sister in [place] would be able to tell more".  " I can't tell the specific trigger, but I am increasingly aware".  She may have some difficulties remembering recent information.  repeats oneself?  Endorsed, but not that much  Disoriented when walking into a room?  Patient denies  Leaving objects in unusual places?   denies   Wandering behavior? denies   Any personality changes ?  Patient gets very frustrated when she cannot remember. She notices that she is more emotional than before.    Any history of depression?: denies   Hallucinations or paranoia?  denies   Seizures? denies    Any sleep changes?  Sleeps well. Denies  vivid dreams, REM behavior or sleepwalking   Sleep apnea?  Denies.  Any hygiene concerns?   denies   Independent of bathing and dressing?  Endorsed  Does the patient need help with medications?  Patient is in charge   Who is in charge of the finances? Husband  is in charge     Any changes in appetite?   denies     Patient have trouble swallowing? She has a history of esophageal stricture, does not want esophageal dilatation because it involves anesthesia.   Does the patient cook? Yes.  Any kitchen accidents such as leaving the stove on? Patient denies   Any headaches?  denies   Chronic back pain?  denies   Ambulates with difficulty? Denies, walks frequently.  Recent falls or head injuries? denies     Vision changes? Denies Unilateral weakness, numbness or tingling?  denies   Any tremors?  denies   Any anosmia?  denies   Any incontinence of urine? denies   Any bowel dysfunction? denies      Patient lives with her husband History of heavy alcohol intake? denies   History of heavy tobacco use? denies   Family history of dementia?  Denies  Does patient drive? Yes, denies  getting lost or any other driving issues  Master's degree Education, BA in [place] and [place] .      PREVIOUS MEDICATIONS:   CURRENT MEDICATIONS:  Outpatient Encounter Medications as of 08/27/2023  Medication Sig   Aspirin 81 MG CAPS Take 81 mg by mouth daily.   levothyroxine (SYNTHROID, LEVOTHROID) 75 MCG tablet Take 50 mcg by mouth daily before breakfast.   memantine (NAMENDA) 10 MG tablet Take 1 tablet (10 mg total) by mouth 2 (two) times daily.   olmesartan (BENICAR) 20 MG tablet Take 20 mg by mouth daily.   Calcium-Magnesium-Vitamin D (CALCIUM MAGNESIUM PO) Take 1 tablet by mouth daily.   Coenzyme Q10 10 MG capsule Take 60 mg by mouth daily.   Cyanocobalamin (VITAMIN B-12 PO) Take by mouth daily.   cyclobenzaprine (FLEXERIL) 5 MG tablet Take 5 mg by mouth daily as needed for muscle spasms.   dicyclomine (BENTYL) 20 MG tablet Take 1 tablet (20 mg total) by mouth every morning.   etodolac (LODINE)  500 MG tablet Take 500 mg by mouth 2 (two) times daily.   hydroxychloroquine (PLAQUENIL) 200 MG tablet Take 200 mg by mouth 2 (two) times daily.   Probiotic Product (PROBIOTIC DAILY PO) Take 1 tablet by mouth daily.   simvastatin (ZOCOR) 40 MG tablet Take 40 mg by mouth every evening.   sucralfate (CARAFATE) 1 G tablet Take 1 tablet (1 g total) by mouth 2 (two) times daily.   No facility-administered encounter medications on file as of 08/27/2023.        No data to display            01/24/2023    9:00 AM  Montreal Cognitive Assessment   Visuospatial/ Executive (0/5) 3  Naming (0/3) 3  Attention: Read list of digits (0/2) 2  Attention: Read list of letters (0/1) 1  Attention: Serial 7 subtraction starting at 100 (0/3) 3  Language: Repeat phrase (0/2) 2  Language : Fluency (0/1) 1  Abstraction (0/2) 1  Delayed Recall (0/5) 0  Orientation (0/6) 5  Total 21  Adjusted Score (based on education) 21    Objective:     PHYSICAL EXAMINATION:    VITALS:   Vitals:   08/27/23 1241 08/27/23 1253  BP: (!) 151/62 (!) 150/77  Pulse: 66   SpO2: 99%   Weight: 147 lb (66.7 kg)   Height: 5\' 3"  (1.6 m)     GEN:  The patient appears stated age and is in NAD. HEENT:  Normocephalic, atraumatic.   Neurological examination:  General: NAD, well-groomed, appears stated age. Orientation: The patient is alert. Oriented to person, place and not to date Cranial nerves: There is good facial symmetry.The speech is fluent and clear. No aphasia or dysarthria. Fund of knowledge is appropriate. Recent memory impaired, remote memory normal.   Attention and concentration are normal.  Able to name objects and repeat phrases.  Hearing is intact to conversational tone.   Sensation: Sensation is intact to light touch throughout Motor: Strength is at least antigravity x4. DTR's 2/4 in UE/LE     Movement examination: Tone: There is normal tone in the UE/LE Abnormal movements:  no tremor.  No  myoclonus.  No asterixis.   Coordination:  There is no decremation with RAM's. Normal finger to nose  Gait and Station: The patient has no difficulty arising out of a deep-seated chair without the use of the hands. The patient's stride length is good.  Gait is cautious and narrow.  Thank you for allowing [place] the opportunity to participate in the care of this nice patient. Please do not hesitate to contact us for any questions or concerns.   Total time spent on today's visit was 40 minutes dedicated to this patient today, preparing to see patient, examining the patient, ordering tests and/or medications and counseling the patient, documenting clinical information in the EHR or other health record, independently interpreting results and communicating results to the patient/family, discussing treatment and goals, answering patient's questions and coordinating care.  Cc:  Majel Homer, MD  Marlowe Kays 08/27/2023 1:05 PM

## 2023-08-27 NOTE — Patient Instructions (Addendum)
It was a pleasure to see you today at our office.   Recommendations:  Follow up in 6  months Continue Memantine 10 mg twice daily. Side effects were discussed  Repeat neuropsychological testing in about 6-7 months  Referral to [facility] to further evaluate for potential study drugs  Call for  ongoing studies for Alzheimer's disease feel free to contact to:  Amy Obssi Clinical Research Coordinator [facility] Department of Neurology Neurosciences Clinical Research Organization Phone: [phone] [email or web address]     For assessment of decision of mental capacity and competency:  Call Dr. Erick Blinks, geriatric psychiatrist at [phone] Counseling regarding caregiver distress, including caregiver depression, anxiety and issues regarding community resources, adult day care programs, adult living facilities, or memory care questions:  please contact your  Primary Doctor's Social Worker  Whom to call: Memory  decline, memory medications: Call our office [phone]  For psychiatric meds, mood meds: Please have your primary care physician manage these medications.  If you have any severe symptoms of a stroke, or other severe issues such as confusion,severe chills or fever, etc call 911 or go to the ER as you may need to be evaluated further    RECOMMENDATIONS FOR ALL PATIENTS WITH MEMORY PROBLEMS: 1. Continue to exercise (Recommend 30 minutes of walking everyday, or 3 hours every week) 2. Increase social interactions - continue going to [place] and enjoy social gatherings with friends and family 3. Eat healthy, avoid fried foods and eat more fruits and vegetables 4. Maintain adequate blood pressure, blood sugar, and blood cholesterol level. Reducing the risk of stroke and cardiovascular disease also helps promoting better memory. 5. Avoid stressful situations. Live a simple life and avoid aggravations. Organize your time and prepare for the next day in  anticipation. 6. Sleep well, avoid any interruptions of sleep and avoid any distractions in the bedroom that may interfere with adequate sleep quality 7. Avoid sugar, avoid sweets as there is a strong link between excessive sugar intake, diabetes, and cognitive impairment We discussed the Mediterranean diet, which has been shown to help patients reduce the risk of progressive memory disorders and reduces cardiovascular risk. This includes eating fish, eat fruits and green leafy vegetables, nuts like almonds and hazelnuts, walnuts, and also use olive oil. Avoid fast foods and fried foods as much as possible. Avoid sweets and sugar as sugar use has been linked to worsening of memory function.  There is always a concern of gradual progression of memory problems. If this is the case, then we may need to adjust level of care according to patient needs. Support, both to the patient and caregiver, should then be put into place.    The Alzheimer[employer] Association is here all day, every day for people facing Alzheimer's disease through our free 24/7 Helpline: [phone]. The Helpline provides reliable information and support to all those who need assistance, such as individuals living with memory loss, Alzheimer's or other dementia, caregivers, health care professionals and the public.  Our highly trained and knowledgeable staff can help you with: Understanding memory loss, dementia and Alzheimer's  Medications and other treatment options  General information about aging and brain health  Skills to provide quality care and to find the best care from professionals  Legal, financial and living-arrangement decisions Our Helpline also features: Confidential care consultation provided by master's level clinicians who can help with decision-making support, crisis assistance and education on issues families face every day  Help in a caller's preferred language using our  translation service that features more than 200  languages and dialects  Referrals to local community programs, services and ongoing support     FALL PRECAUTIONS: Be cautious when walking. Scan the area for obstacles that may increase the risk of trips and falls. When getting up in the mornings, sit up at the edge of the bed for a few minutes before getting out of bed. Consider elevating the bed at the head end to avoid drop of blood pressure when getting up. Walk always in a well-lit room (use night lights in the walls). Avoid area rugs or power cords from appliances in the middle of the walkways. Use a walker or a cane if necessary and consider physical therapy for balance exercise. Get your eyesight checked regularly.  FINANCIAL OVERSIGHT: Supervision, especially oversight when making financial decisions or transactions is also recommended.  HOME SAFETY: Consider the safety of the kitchen when operating appliances like stoves, microwave oven, and blender. Consider having supervision and share cooking responsibilities until no longer able to participate in those. Accidents with firearms and other hazards in the house should be identified and addressed as well.   ABILITY TO BE LEFT ALONE: If patient is unable to contact 911 operator, consider using LifeLine, or when the need is there, arrange for someone to stay with patients. Smoking is a fire hazard, consider supervision or cessation. Risk of wandering should be assessed by caregiver and if detected at any point, supervision and safe proof recommendations should be instituted.  MEDICATION SUPERVISION: Inability to self-administer medication needs to be constantly addressed. Implement a mechanism to ensure safe administration of the medications.   DRIVING: Regarding driving, in patients with progressive memory problems, driving will be impaired. We advise to have someone else do the driving if trouble finding directions or if minor accidents are reported. Independent driving assessment is  available to determine safety of driving.   If you are interested in the driving assessment, you can contact the following:  [employer] in [address] [phone]  [facility] [phone]  [facility] [phone] or [phone]      Mediterranean Diet A Mediterranean diet refers to food and lifestyle choices that are based on the traditions of countries located on the [employer]. This way of eating has been shown to help prevent certain conditions and improve outcomes for people who have chronic diseases, like kidney disease and heart disease. What are tips for following this plan? Lifestyle  Cook and eat meals together with your family, when possible. Drink enough fluid to keep your urine clear or pale yellow. Be physically active every day. This includes: Aerobic exercise like running or swimming. Leisure activities like gardening, walking, or housework. Get 7-8 hours of sleep each night. If recommended by your health care provider, drink red wine in moderation. This means 1 glass a day for nonpregnant women and 2 glasses a day for men. A glass of wine equals 5 oz (150 mL). Reading food labels  Check the serving size of packaged foods. For foods such as rice and pasta, the serving size refers to the amount of cooked product, not dry. Check the total fat in packaged foods. Avoid foods that have saturated fat or trans fats. Check the ingredients list for added sugars, such as corn syrup. Shopping  At the grocery store, buy most of your food from the areas near the walls of the store. This includes: Fresh fruits and vegetables (produce). Grains, beans, nuts,  and seeds. Some of these may be available in unpackaged forms or large amounts (in bulk). Fresh seafood. Poultry and eggs. Low-fat dairy products. Buy whole ingredients instead of prepackaged foods. Buy fresh fruits and vegetables in-season  from local farmers markets. Buy frozen fruits and vegetables in resealable bags. If you do not have access to quality fresh seafood, buy precooked frozen shrimp or canned fish, such as tuna, salmon, or sardines. Buy small amounts of raw or cooked vegetables, salads, or olives from the deli or salad bar at your store. Stock your pantry so you always have certain foods on hand, such as olive oil, canned tuna, canned tomatoes, rice, pasta, and beans. Cooking  Cook foods with extra-virgin olive oil instead of using butter or other vegetable oils. Have meat as a side dish, and have vegetables or grains as your main dish. This means having meat in small portions or adding small amounts of meat to foods like pasta or stew. Use beans or vegetables instead of meat in common dishes like chili or lasagna. Experiment with different cooking methods. Try roasting or broiling vegetables instead of steaming or sauteing them. Add frozen vegetables to soups, stews, pasta, or rice. Add nuts or seeds for added healthy fat at each meal. You can add these to yogurt, salads, or vegetable dishes. Marinate fish or vegetables using olive oil, lemon juice, garlic, and fresh herbs. Meal planning  Plan to eat 1 vegetarian meal one day each week. Try to work up to 2 vegetarian meals, if possible. Eat seafood 2 or more times a week. Have healthy snacks readily available, such as: Vegetable sticks with hummus. Greek yogurt. Fruit and nut trail mix. Eat balanced meals throughout the week. This includes: Fruit: 2-3 servings a day Vegetables: 4-5 servings a day Low-fat dairy: 2 servings a day Fish, poultry, or lean meat: 1 serving a day Beans and legumes: 2 or more servings a week Nuts and seeds: 1-2 servings a day Whole grains: 6-8 servings a day Extra-virgin olive oil: 3-4 servings a day Limit red meat and sweets to only a few servings a month What are my food choices? Mediterranean diet Recommended Grains:  Whole-grain pasta. Brown rice. Bulgar wheat. Polenta. Couscous. Whole-wheat bread. Orpah Cobb. Vegetables: Artichokes. Beets. Broccoli. Cabbage. Carrots. Eggplant. Green beans. Chard. Kale. Spinach. Onions. Leeks. Peas. Squash. Tomatoes. Peppers. Radishes. Fruits: Apples. Apricots. Avocado. Berries. Bananas. Cherries. Dates. Figs. Grapes. Lemons. Melon. Oranges. Peaches. Plums. Pomegranate. Meats and other protein foods: Beans. Almonds. Sunflower seeds. Pine nuts. Peanuts. Cod. Salmon. Scallops. Shrimp. Tuna. Tilapia. Clams. Oysters. Eggs. Dairy: Low-fat milk. Cheese. Greek yogurt. Beverages: Water. Red wine. Herbal tea. Fats and oils: Extra virgin olive oil. Avocado oil. Grape seed oil. Sweets and desserts: [place] yogurt with honey. Baked apples. Poached pears. Trail mix. Seasoning and other foods: Basil. Cilantro. Coriander. Cumin. Mint. Parsley. Sage. Rosemary. Tarragon. Garlic. Oregano. Thyme. Pepper. Balsalmic vinegar. Tahini. Hummus. Tomato sauce. Olives. Mushrooms. Limit these Grains: Prepackaged pasta or rice dishes. Prepackaged cereal with added sugar. Vegetables: Deep fried potatoes (french fries). Fruits: Fruit canned in syrup. Meats and other protein foods: Beef. Pork. Lamb. Poultry with skin. Hot dogs. Tomasa Blase. Dairy: Ice cream. Sour cream. Whole milk. Beverages: Juice. Sugar-sweetened soft drinks. Beer. Liquor and spirits. Fats and oils: Butter. Canola oil. Vegetable oil. Beef fat (tallow). Lard. Sweets and desserts: Cookies. Cakes. Pies. Candy. Seasoning and other foods: Mayonnaise. Premade sauces and marinades. The items listed may not be a complete list. Talk with your dietitian about what dietary  choices are right for you. Summary The Mediterranean diet includes both food and lifestyle choices. Eat a variety of fresh fruits and vegetables, beans, nuts, seeds, and whole grains. Limit the amount of red meat and sweets that you eat. Talk with your health care provider about  whether it is safe for you to drink red wine in moderation. This means 1 glass a day for nonpregnant women and 2 glasses a day for men. A glass of wine equals 5 oz (150 mL). This information is not intended to replace advice given to you by your health care provider. Make sure you discuss any questions you have with your health care provider. Document Released: 04/06/2016 Document Revised: 05/09/2016 Document Reviewed: 04/06/2016 Elsevier Interactive Patient Education  2017 [employer].    [employer] [address]

## 2023-09-03 ENCOUNTER — Ambulatory Visit: Payer: Self-pay

## 2023-09-03 ENCOUNTER — Institutional Professional Consult (permissible substitution): Payer: Medicare Other | Admitting: Psychology

## 2023-09-10 ENCOUNTER — Encounter: Payer: Medicare Other | Admitting: Psychology

## 2023-11-05 ENCOUNTER — Other Ambulatory Visit: Payer: Self-pay

## 2023-11-05 ENCOUNTER — Encounter: Payer: Self-pay | Admitting: Physician Assistant

## 2023-11-05 MED ORDER — MEMANTINE HCL 10 MG PO TABS: 10.0000 mg | ORAL_TABLET | Freq: Two times a day (BID) | ORAL | 3 refills | Status: DC

## 2023-11-05 MED ORDER — MEMANTINE HCL 10 MG PO TABS
10.0000 mg | ORAL_TABLET | Freq: Two times a day (BID) | ORAL | 3 refills | Status: AC
Start: 2023-11-05 — End: ?

## 2023-11-09 ENCOUNTER — Encounter: Payer: Self-pay | Admitting: Physician Assistant

## 2023-11-09 ENCOUNTER — Other Ambulatory Visit: Payer: Self-pay

## 2023-11-09 MED ORDER — DONEPEZIL HCL 10 MG PO TABS: 10.0000 mg | ORAL_TABLET | Freq: Every day | ORAL | 1 refills | Status: DC

## 2023-12-21 ENCOUNTER — Encounter: Payer: Self-pay | Admitting: Physician Assistant

## 2024-02-25 ENCOUNTER — Ambulatory Visit: Payer: Medicare Other | Admitting: Physician Assistant

## 2024-03-02 NOTE — Progress Notes (Signed)
 Assessment/Plan:    Mild cognitive impairment*** Memory impairment***  Melanie Smith is a very pleasant 83 y.o. 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   Patient is on memantine  10 mg twice daily, tolerating well.  Lives with his Beddow her ADLs, no longer drives.  She is awaiting referral to [facility] for evaluation of other therapeutic options.  She is scheduled for neuropsych evaluation in the near future for diagnostic clarity and disease  trajectory***.     Recommendations:   Follow up in   months. Recommend good control of cardiovascular risk factors Continue to control mood as per PCP    Subjective:   This patient is accompanied in the office by ***  who supplements the history. Previous records as well as any outside records available were reviewed prior to todays visit.   Patient was last seen on 08/27/2023, with MMSE 25/30.***.    Any changes in memory since last visit? .  Denies doing crossword puzzles and word finding, playing computer games, reading and watching the news. repeats oneself?  Endorsed Disoriented when walking into a room?  Patient denies ***  Misplacing objects?  Patient denies   Wandering behavior?   Denies. Any personality changes since last visit? Denies.   Any worsening depression?: denies.   Hallucinations or paranoia?  Denies.   Seizures?   Denies.    Any sleep changes? Sleeps well***. Does not sleep very well***.   Denies vivid dreams, REM behavior or sleepwalking   Sleep apnea?   denies ***  Any hygiene concerns?   Denies.   Independent of bathing and dressing?  Endorsed  Does the patient needs help with medications? Patient is in charge *** Who is in charge of the finances?  Patient is in charge, she has around  taking accountant to play cards.   *** Any changes in appetite?  denies ***   Patient have trouble swallowing?  She has a history of esophageal stricture, sometimes she has a hard time swallowing pills. Does the patient cook?  Any kitchen accidents such as leaving the stove on?   Denies.   Any headaches?    Denies.   Vision changes? Denies. Chronic pain?  Denies.   Ambulates with difficulty?  She uses a walking stick when there are heels on her trail.***  Recent falls or head injuries?    Denies.      Unilateral weakness, numbness or tingling?  Denies.   Any tremors?  Denies.   Any anosmia?    Denies.   Any incontinence of urine?  Denies.   Any bowel dysfunction?  Denies.      Patient lives with her husband, son lives nearby monitors her frequently.*** Does the patient drive?  No longer drives***   Neuropsych evaluation 02/06/23 Briefly, results suggested severe impairment surrounding all aspects of learning and memory. Additional performance variability was exhibited across visuospatial abilities. Performances were appropriate relative to age-matched peers across all other assessed cognitive domains. The etiology for ongoing memory impairment is unclear at the present time. With that being said, memory patterns are worrisome for early stages of underlying Alzheimer's disease. Across memory testing, Ms. Bremner did not benefit from repeated exposure to new information across learning trials, was essentially amnestic (i.e., 0% to 20% retention) after a brief delay across  all memory tasks, and performed poorly across yes/no recognition trials. Taken together, this suggests concerns for rapid forgetting and a prominent storage impairment, both of which are the hallmark testing patterns for this illness. Visuospatial variability can also be seen with typical disease progression. 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 Strong  performances across confrontation naming and semantic fluency tasks are encouraging and likely suggest that she remains in earlier stages of this illness if truly present.    Initial visit 01/24/23  How long did patient have memory difficulties? Can't give you an absolute answer but has been present in my mind  for at least 3 years with  my husband noticed for the last year. He is hard of hearing so my conversations with him are not lengthy, my sister in [place] would be able to tell more.   I can't tell the specific trigger, but I am increasingly aware.  She may have some difficulties remembering recent information.  repeats oneself?  Endorsed, but not that much  Disoriented when walking into a room?  Patient denies  Leaving objects in unusual places?   denies   Wandering behavior? denies   Any personality changes ?  Patient gets very frustrated when she cannot remember. She notices that she is more emotional than before.    Any history of depression?: denies   Hallucinations or paranoia?  denies   Seizures? denies    Any sleep changes?  Sleeps well. Denies  vivid dreams, REM behavior or sleepwalking   Sleep apnea?  Denies.  Any hygiene concerns?  denies   Independent of bathing and dressing?  Endorsed  Does the patient need help with medications?  Patient is in charge   Who is in charge of the finances? Husband  is in charge     Any changes in appetite?   denies     Patient have trouble swallowing? She has a history of esophageal stricture, does not want esophageal dilatation because it involves anesthesia.   Does the patient cook? Yes.  Any kitchen accidents such as leaving the stove on? Patient denies   Any headaches?  denies   Chronic back pain?  denies   Ambulates with difficulty? Denies, walks frequently.  Recent falls or head injuries? denies     Vision changes? Denies Unilateral weakness, numbness or tingling?  denies   Any tremors?  denies   Any anosmia?  denies   Any  incontinence of urine? denies   Any bowel dysfunction? denies      Patient lives with her husband History of heavy alcohol intake? denies   History of heavy tobacco use? denies   Family history of dementia?  Denies  Does patient drive? Yes, denies getting lost or any other driving issues  Master's degree Education, BA in [place] and [place] .  She had extensive neurological evaluation while in [place], [place] , including checking for APO E resulting on E3/E3 genotype. On October 29, 2022  PET/CT showed positive cortical activity/amyloid deposition throughout the frontal, parietal, temporal and occipital lobes with loss of gray-white matter contrast.  MRI performed at [facility] 12/21/2022  remarkable  for chronic small vessel disease seen today in follow up for memory loss. She had a neuropsychological evaluation on 02/06/23 yielding a diagnosis of Amnestic MCI likely due to  Alzheimer's disease.   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   Chronic obstructive pulmonary disease 02/18/2001   Deficiency anemia 03/27/2007   Degeneration of cervical intervertebral disc    Degeneration of lumbar or lumbosacral intervertebral disc 12/09/2004   Diverticulosis    Dysphagia 02/15/2015   Enthesopathy of hip region    Full incontinence of feces    Functional urinary incontinence 12/26/2016   Gastroesophageal reflux disease without esophagitis 02/15/2000   Generalized osteoarthrosis, involving multiple sites    Heart murmur 05/27/2019   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 10/29/2019   Hypertension, benign 03/25/2018   Leg swelling 02/24/2018   Lumbar back pain with radiculopathy affecting right lower extremity 01/08/2018   Major depressive disorder 11/25/2001   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   Mild aortic regurgitation 07/06/2017   Mild mitral regurgitation 07/06/2017   Mixed hyperlipidemia    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   Calcium-Magnesium-Vitamin D (CALCIUM MAGNESIUM PO) Take 1 tablet by mouth daily.   Cholecalciferol 50 MCG (2000 UT) TABS Take 250 Units by mouth daily. (Patient not taking: Reported on 08/27/2023)   Coenzyme Q10 10 MG capsule Take 60 mg by mouth daily.   Cyanocobalamin  (VITAMIN B-12 PO) Take by mouth daily. (Patient not taking: Reported on 08/27/2023)   cyclobenzaprine (FLEXERIL) 5 MG tablet Take 5 mg by mouth daily as needed for muscle spasms. (Patient not taking: Reported on 08/27/2023)   dicyclomine  (BENTYL ) 20 MG tablet Take 1 tablet (20 mg total) by mouth every morning. (Patient not taking: Reported on 08/27/2023)   donepezil  (ARICEPT ) 10 MG tablet Take 1 tablet (10 mg total) by mouth at bedtime.   etodolac (LODINE) 500 MG tablet Take 500 mg by mouth 2 (two) times daily. (Patient not taking: Reported on 08/27/2023)   hydroxychloroquine (PLAQUENIL) 200 MG tablet Take 200 mg by mouth 2 (two) times daily. (Patient not taking: Reported on 08/27/2023)   levothyroxine (SYNTHROID, LEVOTHROID) 75 MCG tablet Take 50 mcg by mouth daily before breakfast.   memantine  (NAMENDA ) 10 MG tablet Take 1 tablet (10 mg total) by mouth 2 (two) times daily.   Multiple Vitamins-Minerals  (PRESERVISION AREDS 2) CAPS Take 1 capsule by mouth in the morning and at bedtime.   olmesartan (BENICAR) 20 MG tablet Take 20 mg by mouth daily.   Probiotic Product (PROBIOTIC DAILY PO) Take 1 tablet by mouth daily. (Patient not taking: Reported on 08/27/2023)   simvastatin (ZOCOR) 40 MG tablet Take 40 mg by mouth every evening. (Patient not taking: Reported on 08/27/2023)   sucralfate  (CARAFATE ) 1 G tablet Take 1 tablet (1 g total) by mouth 2 (two) times daily. (Patient not taking: Reported on 08/27/2023)   No facility-administered encounter medications on file as of 03/03/2024.     Objective:     PHYSICAL EXAMINATION:    VITALS:  There were no vitals filed for this visit.  GEN:  The patient appears stated age and is in NAD. HEENT:  Normocephalic, atraumatic.   Neurological examination:  General: NAD, well-groomed, appears stated age. Orientation: The patient is alert. Oriented to person, place and not to date.*** Cranial nerves: There is good facial symmetry.The speech is fluent and clear. No aphasia or dysarthria. Fund of knowledge is appropriate. Recent memory impaired and remote memory is normal.  Attention and concentration are normal.  Able to name objects and repeat phrases.  Hearing is intact to conversational tone ***.   Delayed recall *** Sensation: Sensation is intact to light touch throughout Motor: Strength is at least antigravity x4. DTR's 2/4 in UE/LE      01/24/2023    9:00 AM  Montreal Cognitive Assessment   Visuospatial/ Executive (0/5) 3  Naming (0/3) 3  Attention: Read list of digits (0/2) 2  Attention: Read list of letters (0/1) 1  Attention: Serial 7 subtraction starting at 100 (0/3) 3  Language: Repeat phrase (0/2) 2  Language : Fluency (0/1) 1  Abstraction (0/2) 1  Delayed Recall (0/5) 0  Orientation (0/6) 5  Total 21  Adjusted Score (based on education) 21       08/27/2023    2:00 PM  MMSE - Mini Mental State Exam  Orientation to time 2   Orientation to Place 5  Registration 3  Attention/ Calculation 4  Recall 2  Language- name 2 objects 2  Language- repeat 1  Language- follow 3 step command 3  Language- read & follow direction 1  Write a sentence 1  Copy design 1  Total score 25       Movement examination: Tone: There is normal tone in the UE/LE Abnormal movements:  no tremor.  No myoclonus.  No asterixis.   Coordination:  There is no decremation with RAM's. Normal finger to nose  Gait and Station: The patient has no difficulty arising out of a deep-seated chair without the use of the hands. The patient's stride length is good.  Gait is cautious and narrow.   Thank you for allowing us  the opportunity to participate in the care of this nice patient. Please do not hesitate to contact us  for any questions or concerns.   Total time spent on today's visit was *** minutes dedicated to this patient today, preparing to see patient, examining the patient, ordering tests and/or medications and counseling the patient, documenting clinical information in the EHR or other health record, independently interpreting results and communicating results to the patient/family, discussing treatment and goals, answering patient's questions and coordinating care.  Cc:  Lawrance Handing, MD  Camie Sevin 03/02/2024 3:23 PM

## 2024-03-03 ENCOUNTER — Ambulatory Visit (INDEPENDENT_AMBULATORY_CARE_PROVIDER_SITE_OTHER): Admitting: Physician Assistant

## 2024-03-03 VITALS — BP 136/88 | HR 72 | Ht 63.0 in | Wt 150.2 lb

## 2024-03-03 DIAGNOSIS — G3184 Mild cognitive impairment, so stated: Secondary | ICD-10-CM

## 2024-03-03 NOTE — Patient Instructions (Signed)
 It was a pleasure to see you today at our office.   Recommendations:  Follow up in 6  months Continue Memantine  10 mg twice daily. Side effects were discussed      For assessment of decision of mental capacity and competency:  Call Dr. Rosaline Nine, geriatric psychiatrist at [phone] Counseling regarding caregiver distress, including caregiver depression, anxiety and issues regarding community resources, adult day care programs, adult living facilities, or memory care questions:  please contact your  Primary Doctor's Social Worker  Whom to call: Memory  decline, memory medications: Call our office [phone]  For psychiatric meds, mood meds: Please have your primary care physician manage these medications.  If you have any severe symptoms of a stroke, or other severe issues such as confusion,severe chills or fever, etc call 911 or go to the ER as you may need to be evaluated further    RECOMMENDATIONS FOR ALL PATIENTS WITH MEMORY PROBLEMS: 1. Continue to exercise (Recommend 30 minutes of walking everyday, or 3 hours every week) 2. Increase social interactions - continue going to [place] and enjoy social gatherings with friends and family 3. Eat healthy, avoid fried foods and eat more fruits and vegetables 4. Maintain adequate blood pressure, blood sugar, and blood cholesterol level. Reducing the risk of stroke and cardiovascular disease also helps promoting better memory. 5. Avoid stressful situations. Live a simple life and avoid aggravations. Organize your time and prepare for the next day in anticipation. 6. Sleep well, avoid any interruptions of sleep and avoid any distractions in the bedroom that may interfere with adequate sleep quality 7. Avoid sugar, avoid sweets as there is a strong link between excessive sugar intake, diabetes, and cognitive impairment We discussed the Mediterranean diet, which has been shown to help patients reduce the risk of progressive memory disorders  and reduces cardiovascular risk. This includes eating fish, eat fruits and green leafy vegetables, nuts like almonds and hazelnuts, walnuts, and also use olive oil. Avoid fast foods and fried foods as much as possible. Avoid sweets and sugar as sugar use has been linked to worsening of memory function.  There is always a concern of gradual progression of memory problems. If this is the case, then we may need to adjust level of care according to patient needs. Support, both to the patient and caregiver, should then be put into place.    The [employer] is here all day, every day for people facing Alzheimer's disease through our free 24/7 Helpline: [phone]. The Helpline provides reliable information and support to all those who need assistance, such as individuals living with memory loss, Alzheimer's or other dementia, caregivers, health care professionals and the public.  Our highly trained and knowledgeable staff can help you with: Understanding memory loss, dementia and Alzheimer's  Medications and other treatment options  General information about aging and brain health  Skills to provide quality care and to find the best care from professionals  Legal, financial and living-arrangement decisions Our Helpline also features: Confidential care consultation provided by master's level clinicians who can help with decision-making support, crisis assistance and education on issues families face every day  Help in a caller's preferred language using our translation service that features more than 200 languages and dialects  Referrals to local community programs, services and ongoing support     FALL PRECAUTIONS: Be cautious when walking. Scan the area for obstacles that may increase the risk of trips and falls. When getting up in the mornings, sit up  at the edge of the bed for a few minutes before getting out of bed. Consider elevating the bed at the head end to avoid drop of blood  pressure when getting up. Walk always in a well-lit room (use night lights in the walls). Avoid area rugs or power cords from appliances in the middle of the walkways. Use a walker or a cane if necessary and consider physical therapy for balance exercise. Get your eyesight checked regularly.  FINANCIAL OVERSIGHT: Supervision, especially oversight when making financial decisions or transactions is also recommended.  HOME SAFETY: Consider the safety of the kitchen when operating appliances like stoves, microwave oven, and blender. Consider having supervision and share cooking responsibilities until no longer able to participate in those. Accidents with firearms and other hazards in the house should be identified and addressed as well.   ABILITY TO BE LEFT ALONE: If patient is unable to contact 911 operator, consider using LifeLine, or when the need is there, arrange for someone to stay with patients. Smoking is a fire hazard, consider supervision or cessation. Risk of wandering should be assessed by caregiver and if detected at any point, supervision and safe proof recommendations should be instituted.  MEDICATION SUPERVISION: Inability to self-administer medication needs to be constantly addressed. Implement a mechanism to ensure safe administration of the medications.   DRIVING: Regarding driving, in patients with progressive memory problems, driving will be impaired. We advise to have someone else do the driving if trouble finding directions or if minor accidents are reported. Independent driving assessment is available to determine safety of driving.   If you are interested in the driving assessment, you can contact the following:  [facility] [employer] in [place] [phone]  [facility] [phone]  [facility] [phone] or [phone]      Mediterranean Diet A Mediterranean diet refers to food and lifestyle  choices that are based on the traditions of countries located on the [employer]. This way of eating has been shown to help prevent certain conditions and improve outcomes for people who have chronic diseases, like kidney disease and heart disease. What are tips for following this plan? Lifestyle  Cook and eat meals together with your family, when possible. Drink enough fluid to keep your urine clear or pale yellow. Be physically active every day. This includes: Aerobic exercise like running or swimming. Leisure activities like gardening, walking, or housework. Get 7-8 hours of sleep each night. If recommended by your health care provider, drink red wine in moderation. This means 1 glass a day for nonpregnant women and 2 glasses a day for men. A glass of wine equals 5 oz (150 mL). Reading food labels  Check the serving size of packaged foods. For foods such as rice and pasta, the serving size refers to the amount of cooked product, not dry. Check the total fat in packaged foods. Avoid foods that have saturated fat or trans fats. Check the ingredients list for added sugars, such as corn syrup. Shopping  At the grocery store, buy most of your food from the areas near the walls of the store. This includes: Fresh fruits and vegetables (produce). Grains, beans, nuts, and seeds. Some of these may be available in unpackaged forms or large amounts (in bulk). Fresh seafood. Poultry and eggs. Low-fat dairy products. Buy whole ingredients instead of prepackaged foods. Buy fresh fruits and vegetables in-season from local farmers markets. Buy frozen fruits and vegetables in resealable bags. If you do  not have access to quality fresh seafood, buy precooked frozen shrimp or canned fish, such as tuna, salmon, or sardines. Buy small amounts of raw or cooked vegetables, salads, or olives from the deli or salad bar at your store. Stock your pantry so you always have certain foods on hand, such as olive  oil, canned tuna, canned tomatoes, rice, pasta, and beans. Cooking  Cook foods with extra-virgin olive oil instead of using butter or other vegetable oils. Have meat as a side dish, and have vegetables or grains as your main dish. This means having meat in small portions or adding small amounts of meat to foods like pasta or stew. Use beans or vegetables instead of meat in common dishes like chili or lasagna. Experiment with different cooking methods. Try roasting or broiling vegetables instead of steaming or sauteing them. Add frozen vegetables to soups, stews, pasta, or rice. Add nuts or seeds for added healthy fat at each meal. You can add these to yogurt, salads, or vegetable dishes. Marinate fish or vegetables using olive oil, lemon juice, garlic, and fresh herbs. Meal planning  Plan to eat 1 vegetarian meal one day each week. Try to work up to 2 vegetarian meals, if possible. Eat seafood 2 or more times a week. Have healthy snacks readily available, such as: Vegetable sticks with hummus. Greek yogurt. Fruit and nut trail mix. Eat balanced meals throughout the week. This includes: Fruit: 2-3 servings a day Vegetables: 4-5 servings a day Low-fat dairy: 2 servings a day Fish, poultry, or lean meat: 1 serving a day Beans and legumes: 2 or more servings a week Nuts and seeds: 1-2 servings a day Whole grains: 6-8 servings a day Extra-virgin olive oil: 3-4 servings a day Limit red meat and sweets to only a few servings a month What are my food choices? Mediterranean diet Recommended Grains: Whole-grain pasta. Brown rice. Bulgar wheat. Polenta. Couscous. Whole-wheat bread. [place] Made[place]. Vegetables: Artichokes. Beets. Broccoli. Cabbage. Carrots. Eggplant. Green beans. Chard. Kale. Spinach. Onions. Leeks. Peas. Squash. Tomatoes. Peppers. Radishes. Fruits: Apples. Apricots. Avocado. Berries. Bananas. Cherries. Dates. Figs. Grapes. Lemons. Melon. Oranges. Peaches. Plums.  Pomegranate. Meats and other protein foods: Beans. Almonds. Sunflower seeds. Pine nuts. Peanuts. Cod. Salmon. Scallops. Shrimp. Tuna. Tilapia. Clams. Oysters. Eggs. Dairy: Low-fat milk. Cheese. Greek yogurt. Beverages: Water. Red wine. Herbal tea. Fats and oils: Extra virgin olive oil. Avocado oil. Grape seed oil. Sweets and desserts: [place] yogurt with honey. Baked apples. Poached pears. Trail mix. Seasoning and other foods: Basil. Cilantro. Coriander. Cumin. Mint. Parsley. Sage. Rosemary. Tarragon. Garlic. Oregano. Thyme. Pepper. Balsalmic vinegar. Tahini. Hummus. Tomato sauce. Olives. Mushrooms. Limit these Grains: Prepackaged pasta or rice dishes. Prepackaged cereal with added sugar. Vegetables: Deep fried potatoes (french fries). Fruits: Fruit canned in syrup. Meats and other protein foods: Beef. Pork. Lamb. Poultry with skin. Hot dogs. [place]. Dairy: Ice cream. Sour cream. Whole milk. Beverages: Juice. Sugar-sweetened soft drinks. Beer. Liquor and spirits. Fats and oils: Butter. Canola oil. Vegetable oil. Beef fat (tallow). Lard. Sweets and desserts: Cookies. Cakes. Pies. Candy. Seasoning and other foods: Mayonnaise. Premade sauces and marinades. The items listed may not be a complete list. Talk with your dietitian about what dietary choices are right for you. Summary The Mediterranean diet includes both food and lifestyle choices. Eat a variety of fresh fruits and vegetables, beans, nuts, seeds, and whole grains. Limit the amount of red meat and sweets that you eat. Talk with your health care provider about whether it is safe for  you to drink red wine in moderation. This means 1 glass a day for nonpregnant women and 2 glasses a day for men. A glass of wine equals 5 oz (150 mL). This information is not intended to replace advice given to you by your health care provider. Make sure you discuss any questions you have with your health care provider. Document Released: 04/06/2016 Document  Revised: 05/09/2016 Document Reviewed: 04/06/2016 Elsevier Interactive Patient Education  2017 [employer].    [employer] [address]

## 2024-03-04 ENCOUNTER — Other Ambulatory Visit: Payer: Self-pay | Admitting: Physician Assistant

## 2024-03-04 MED ORDER — DONEPEZIL HCL 10 MG PO TABS: 10.0000 mg | ORAL_TABLET | Freq: Every day | ORAL | 3 refills | Status: AC

## 2024-04-14 ENCOUNTER — Ambulatory Visit: Admitting: Physician Assistant

## 2024-07-21 ENCOUNTER — Institutional Professional Consult (permissible substitution): Payer: Medicare Other | Admitting: Psychology

## 2024-07-21 ENCOUNTER — Ambulatory Visit: Payer: Self-pay

## 2024-07-30 ENCOUNTER — Encounter: Payer: Medicare Other | Admitting: Psychology

## 2024-09-04 ENCOUNTER — Ambulatory Visit: Admitting: Physician Assistant

## 2024-10-23 ENCOUNTER — Ambulatory Visit: Payer: Self-pay

## 2024-10-23 ENCOUNTER — Institutional Professional Consult (permissible substitution): Admitting: Psychology

## 2024-10-30 ENCOUNTER — Encounter: Admitting: Psychology

## 2024-11-10 ENCOUNTER — Ambulatory Visit: Admitting: Physician Assistant
